# Patient Record
Sex: Female | Born: 1995 | Race: White | Hispanic: No | Smoking: Never smoker
Health system: Southern US, Community
[De-identification: ages and names within clinical notes are randomized; demographics above are authoritative.]

## PROBLEM LIST (undated history)

## (undated) DIAGNOSIS — F419 Anxiety disorder, unspecified: Secondary | ICD-10-CM

## (undated) DIAGNOSIS — F329 Major depressive disorder, single episode, unspecified: Secondary | ICD-10-CM

## (undated) DIAGNOSIS — F32A Depression, unspecified: Secondary | ICD-10-CM

## (undated) HISTORY — DX: Anxiety disorder, unspecified: F41.9

## (undated) HISTORY — DX: Depression, unspecified: F32.A

## (undated) HISTORY — DX: Major depressive disorder, single episode, unspecified: F32.9

## (undated) HISTORY — PX: TONSILLECTOMY AND ADENOIDECTOMY: SUR1326

---
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
## 2017-03-07 ENCOUNTER — Ambulatory Visit (INDEPENDENT_AMBULATORY_CARE_PROVIDER_SITE_OTHER): Payer: Managed Care, Other (non HMO) | Admitting: Family Medicine

## 2017-03-07 ENCOUNTER — Encounter: Payer: Self-pay | Admitting: Family Medicine

## 2017-03-07 VITALS — BP 110/81 | HR 80 | Temp 98.6°F | Resp 16 | Ht 69.0 in | Wt 228.5 lb

## 2017-03-07 DIAGNOSIS — F411 Generalized anxiety disorder: Secondary | ICD-10-CM | POA: Diagnosis not present

## 2017-03-07 MED ORDER — SERTRALINE HCL 100 MG PO TABS: 100.0000 mg | ORAL_TABLET | Freq: Every day | ORAL | 3 refills | Status: DC

## 2017-03-07 NOTE — Assessment & Plan Note (Signed)
Deteriorated.  Pt is going through a very stressful time w/ upcoming graduation.  A lot of uncertainty.  Explained to her that this is normal and does not indicate there's something wrong with her.  Will increase Zoloft to 100mg  daily.  Encouraged stress management.  She is cutting back her time at work.  Applauded this decision.

## 2017-03-07 NOTE — Patient Instructions (Signed)
Follow up in 1 month to recheck anxiety Increase the Zoloft to 100mg - 2 of what you have at home and 1 of the new prescription Continue to work on time for you!!!  I'm proud of you for cutting back at work! Call with any questions or concerns! CONGRATS!!!!!!

## 2017-03-07 NOTE — Progress Notes (Signed)
   Subjective:    Patient ID: Stefanie Farrell, female    DOB: 07-Nov-1995, 21 y.o.   MRN: [id number]  HPI Anxiety- currently on Zoloft 50mg  daily.  Pt is working 20 hrs/week and going to school.  Pt has been crying daily x2 weeks.  Prior to 2 weeks ago, pt noted that anxiety sxs were not as well controlled as when she started the medication but better than the last 2 weeks.  Pt told work that she needed to cut back to 3 days next week (works at [employer]).  Pt reports sleeping well at night.  Pt is working on stress relief.  Has not had any additional seizure like spells.   Review of Systems For ROS see HPI     Objective:   Physical Exam  Constitutional: She is oriented to person, place, and time. She appears well-developed and well-nourished. No distress.  obese  HENT:  Head: Normocephalic and atraumatic.  Neurological: She is alert and oriented to person, place, and time.  Skin: Skin is warm and dry.  Psychiatric: She has a normal mood and affect. Her behavior is normal. Thought content normal.  Vitals reviewed.         Assessment & Plan:

## 2017-03-07 NOTE — Progress Notes (Signed)
Pre visit review using our clinic review tool, if applicable. No additional management support is needed unless otherwise documented below in the visit note. 

## 2017-03-18 ENCOUNTER — Other Ambulatory Visit: Payer: Self-pay | Admitting: Family Medicine

## 2017-04-08 ENCOUNTER — Ambulatory Visit (INDEPENDENT_AMBULATORY_CARE_PROVIDER_SITE_OTHER): Payer: Managed Care, Other (non HMO) | Admitting: Family Medicine

## 2017-04-08 ENCOUNTER — Encounter: Payer: Self-pay | Admitting: Family Medicine

## 2017-04-08 VITALS — BP 112/74 | HR 90 | Resp 16 | Ht 69.0 in | Wt 233.5 lb

## 2017-04-08 DIAGNOSIS — Z3009 Encounter for other general counseling and advice on contraception: Secondary | ICD-10-CM

## 2017-04-08 DIAGNOSIS — F411 Generalized anxiety disorder: Secondary | ICD-10-CM | POA: Diagnosis not present

## 2017-04-08 MED ORDER — SERTRALINE HCL 100 MG PO TABS: 100.0000 mg | ORAL_TABLET | Freq: Every day | ORAL | 3 refills | Status: DC

## 2017-04-08 MED ORDER — NORETHINDRONE ACET-ETHINYL EST 1-20 MG-MCG PO TABS: 1.0000 | ORAL_TABLET | Freq: Every day | ORAL | 11 refills | Status: DC

## 2017-04-08 NOTE — Assessment & Plan Note (Signed)
New.  Pt is not yet sexually active but plans to be.  Applauded her decision to protect herself.  Start OCPs.  Reviewed appropriate start date, possible side effects.  Pt expressed understanding and is in agreement w/ plan.

## 2017-04-08 NOTE — Progress Notes (Signed)
   Subjective:    Patient ID: Stefanie Farrell, female    DOB: [birth year], 21 y.o.   MRN: [id number]  HPI Anxiety- improved since increasing Zoloft to 100mg  daily.  Pt reports less anxiety, feeling less wound up.  Able to fall asleep but some tossing and turning at night.  Pt reports being happy w/ dose.  Irregular menses- pt reports 'very irregular periods'.  Was on OCPs previously but 'gained 50 lbs'.   Review of Systems For ROS see HPI     Objective:   Physical Exam  Constitutional: She is oriented to person, place, and time. She appears well-developed and well-nourished. No distress.  obese  Neurological: She is alert and oriented to person, place, and time.  Skin: Skin is warm and dry.  Psychiatric: She has a normal mood and affect. Her behavior is normal. Thought content normal.  Vitals reviewed.         Assessment & Plan:

## 2017-04-08 NOTE — Progress Notes (Signed)
Pre visit review using our clinic review tool, if applicable. No additional management support is needed unless otherwise documented below in the visit note. 

## 2017-04-08 NOTE — Assessment & Plan Note (Signed)
Improved since increasing Zoloft.  Pt is happy w/ dose.  No med changes at this time.

## 2017-04-08 NOTE — Patient Instructions (Signed)
Follow up as needed/scheduled Start the birth control the Sunday after you start your cycle Continue the Zoloft once daily- I'm so glad you're feeling better! Call with any questions or concerns Have a great summer!!!

## 2017-04-28 ENCOUNTER — Encounter: Payer: Self-pay | Admitting: Family Medicine

## 2017-04-28 ENCOUNTER — Encounter (HOSPITAL_COMMUNITY): Payer: Self-pay | Admitting: *Deleted

## 2017-04-28 ENCOUNTER — Ambulatory Visit (INDEPENDENT_AMBULATORY_CARE_PROVIDER_SITE_OTHER): Payer: Managed Care, Other (non HMO) | Admitting: Family Medicine

## 2017-04-28 ENCOUNTER — Emergency Department (HOSPITAL_BASED_OUTPATIENT_CLINIC_OR_DEPARTMENT_OTHER)
Admit: 2017-04-28 | Discharge: 2017-04-28 | Disposition: A | Discharge status: Home / Self Care | Payer: Managed Care, Other (non HMO)

## 2017-04-28 ENCOUNTER — Emergency Department (HOSPITAL_COMMUNITY)
Admission: EM | Admit: 2017-04-28 | Discharge: 2017-04-28 | Disposition: A | Discharge status: Home / Self Care | Payer: Managed Care, Other (non HMO) | Attending: Emergency Medicine | Admitting: Emergency Medicine

## 2017-04-28 VITALS — BP 114/80 | HR 89 | Temp 99.3°F | Resp 16 | Ht 69.0 in | Wt 239.0 lb

## 2017-04-28 DIAGNOSIS — R319 Hematuria, unspecified: Secondary | ICD-10-CM | POA: Diagnosis not present

## 2017-04-28 DIAGNOSIS — R8299 Other abnormal findings in urine: Secondary | ICD-10-CM

## 2017-04-28 DIAGNOSIS — M791 Myalgia, unspecified site: Secondary | ICD-10-CM

## 2017-04-28 DIAGNOSIS — R82998 Other abnormal findings in urine: Secondary | ICD-10-CM

## 2017-04-28 DIAGNOSIS — R252 Cramp and spasm: Secondary | ICD-10-CM | POA: Insufficient documentation

## 2017-04-28 LAB — BASIC METABOLIC PANEL
Anion gap: 6 (ref 5–15)
BUN: 12 mg/dL (ref 6–20)
CHLORIDE: 105 mmol/L (ref 101–111)
CO2: 26 mmol/L (ref 22–32)
CREATININE: 0.85 mg/dL (ref 0.44–1.00)
Calcium: 9.3 mg/dL (ref 8.9–10.3)
GFR calc Af Amer: >60 mL/min (ref >60)
GFR calc non Af Amer: >60 mL/min (ref >60)
GLUCOSE: 96 mg/dL (ref 65–99)
Potassium: 4 mmol/L (ref 3.5–5.1)
Sodium: 137 mmol/L (ref 135–145)

## 2017-04-28 LAB — CBC WITH DIFFERENTIAL/PLATELET
Basophils Absolute: 0.1 10*3/uL (ref 0.0–0.1)
Basophils Relative: 1 %
EOS ABS: 0.3 10*3/uL (ref 0.0–0.7)
EOS PCT: 3 %
HCT: 38.9 % (ref 36.0–46.0)
HEMOGLOBIN: 12.8 g/dL (ref 12.0–15.0)
LYMPHS ABS: 2.7 10*3/uL (ref 0.7–4.0)
Lymphocytes Relative: 31 %
MCH: 30.1 pg (ref 26.0–34.0)
MCHC: 32.9 g/dL (ref 30.0–36.0)
MCV: 91.5 fL (ref 78.0–100.0)
MONOS PCT: 7 %
Monocytes Absolute: 0.6 10*3/uL (ref 0.1–1.0)
NEUTROS PCT: 58 %
Neutro Abs: 5.2 10*3/uL (ref 1.7–7.7)
Platelets: 268 10*3/uL (ref 150–400)
RBC: 4.25 MIL/uL (ref 3.87–5.11)
RDW: 13.2 % (ref 11.5–15.5)
WBC: 8.9 10*3/uL (ref 4.0–10.5)

## 2017-04-28 LAB — POCT URINALYSIS DIPSTICK
Bilirubin, UA: NEGATIVE
Glucose, UA: NEGATIVE
Nitrite, UA: NEGATIVE
PH UA: 6.5 (ref 5.0–8.0)
SPEC GRAV UA: 1.02 (ref 1.010–1.025)
UROBILINOGEN UA: 0.2 U/dL

## 2017-04-28 LAB — URINALYSIS, ROUTINE W REFLEX MICROSCOPIC
Bilirubin Urine: NEGATIVE
Glucose, UA: NEGATIVE mg/dL
Hgb urine dipstick: NEGATIVE
KETONES UR: NEGATIVE mg/dL
Nitrite: NEGATIVE
PH: 6 (ref 5.0–8.0)
PROTEIN: NEGATIVE mg/dL
Specific Gravity, Urine: 1.02 (ref 1.005–1.030)

## 2017-04-28 LAB — MAGNESIUM: MAGNESIUM: 2 mg/dL (ref 1.7–2.4)

## 2017-04-28 LAB — POC URINE PREG, ED: PREG TEST UR: NEGATIVE

## 2017-04-28 NOTE — ED Notes (Signed)
Patient ambulated to BR and tolerated well. Patient providing urine specimen.

## 2017-04-28 NOTE — Progress Notes (Signed)
Preliminary results by tech - Venous Duplex Lower Ext. Completed. Negative for deep and superficial vein thrombosis in both legs.  Denine Brotz, BS, RDMS, RVT  

## 2017-04-28 NOTE — Progress Notes (Signed)
   Subjective:    Patient ID: Stefanie Farrell, female    DOB: 1996/01/05, 21 y.o.   MRN: [id number]  HPI ER f/u- pt was seen today in ER after awaking w/ severe leg cramps in feet and extending up legs.  Mom was concerned about possible blood clots in setting of birth control.  Pt reports legs feel better after using heating pad but still are sore and weak.  Pt reports drinking fluids.  [place] had adequate water intake.  Pt reports feeling well prior to sudden onset of leg pain.  Pt reports feeling well now.  Denies urinary sxs w/ exception of cloudy urine   Review of Systems For ROS see HPI     Objective:   Physical Exam  Constitutional: Stefanie Farrell is oriented to person, place, and time. Stefanie Farrell appears well-developed and well-nourished. No distress.  obese  HENT:  Head: Normocephalic and atraumatic.  Musculoskeletal: Stefanie Farrell exhibits tenderness (mild TTP over bilateral lower legs.  no TTP over neck, shoulders, arms, back, thighs). Stefanie Farrell exhibits no edema.  Neurological: Stefanie Farrell is alert and oriented to person, place, and time.  Skin: Skin is warm and dry. No rash noted. No erythema.  Psychiatric: Stefanie Farrell has a normal mood and affect. Her behavior is normal. Thought content normal.  Vitals reviewed.         Assessment & Plan:  Myalgias- new.  Suspect pt's muscle pain/cramps are due to either dehydration from her weekend at the lake or a viral illness as Stefanie Farrell has a low grade temp.  Reviewed lab results from ER- WBC and Hgb normal.  Venous dopplers WNL.  Check LFTs, CK.  Encouraged ibuprofen and hydration.  Will follow  Leukocytes in urine- new.  Reviewed UA from ED and given multiple abnormalities will send urine for culture despite pt's lack of sxs.  Pt expressed understanding and is in agreement w/ plan.

## 2017-04-28 NOTE — Discharge Instructions (Addendum)
You were seen in the emergency department for muscle cramps. Your exam and workup was reassuring. Your ultrasound did not show a blood clot. Please follow up with your primary care doctor today at 2:00PM regarding today's ER visit and medications; your appointment has already been scheduled. Return to the ER if you experience fevers, chills, unexplained weight loss, dizziness, headaches, neck pain, vision or gait changes, chest pain, shortness of breath, abdominal pain, nausea, vomiting, extremity numbness or tingling,extremity weakness, rash, worsening symptoms, or any additional concerns.

## 2017-04-28 NOTE — Patient Instructions (Signed)
Follow up as needed/scheduled We'll notify you of your lab results and urine culture Drink plenty of fluids!  If you think you've had enough- drink some more! Ibuprofen as needed for pain Alternate ice/heat or whichever feels better Call with any questions or concerns Hang in there!!!

## 2017-04-28 NOTE — ED Provider Notes (Signed)
[facility] Emergency Department Provider Note  ED Clinical Impression   Muscle cramps  History   Chief Complaint No chief complaint on file.   HPI  Patient is a 21 y.o. female with a PMH of anxiety who presents to ED for myalgias to bilateral lower legs, onset at 0400 today, states constant since, no known exacerbating or alleviating factors, has not tried OTC medications. States that she was recently restarted on OCPs, currently only Loestrin; no other medications changes, on Zoloft for apporx 1 year without issue. Denies extremity numbness, tingling, or weakness. Has not noticed swelling or redness to bilateral LE. No recent trauma, fall, or known injury to LE. Also states that she recently was outside this weekend but was drinking a lot of water to stay hydrated. Denies recent travel, prolonged immobilization, recent surgery, or hx of DVT/PE. No anticoag use. Denies fevers, chills, unexplained weight loss, dizziness, vision or gait changes, CP, SOB, cough, pleurisy, abd pain, n/v/d, dysuria, hematuria, vaginal bleeding or discharge, extremity numbness or tingling, extremity weakness, or any additional concerns.   No past medical history on file.  Past Surgical History:  Procedure Laterality Date  . TONSILLECTOMY AND ADENOIDECTOMY      Current Outpatient Rx  . Order #: [id number] Class: Normal  . Order #: [id number] Class: Normal    Allergies Lexapro [escitalopram oxalate] and Penicillin g  No family history on file.  Social History Social History  Substance Use Topics  . Smoking status: Never Smoker  . Smokeless tobacco: Never Used  . Alcohol use No    Review of Systems  Constitutional: Negative for fever, chills, or unexplained weight loss. Eyes: Negative for visual changes. Cardiovascular: Negative for chest pain, palpitations, or extremity swelling. Respiratory: Negative for shortness of breath, cough, or pleurisy. Gastrointestinal: Negative for abdominal pain,  nausea, vomiting, or diarrhea. Genitourinary: Negative for dysuria, urinary frequency, or hematuria. Musculoskeletal: +muscle cramps. Negative for back pain or extremity swelling. Skin: Negative for rash. Neurological: Negative for headaches, dizziness, focal weakness, or numbness/tingling.  Physical Exam   VITAL SIGNS:   ED Triage Vitals  Enc Vitals Group     BP      Pulse      Resp      Temp      Temp src      SpO2      Weight      Height      Head Circumference      Peak Flow      Pain Score      Pain Loc      Pain Edu?      Excl. in GC?     Constitutional: Alert and oriented. Well appearing and in no respiratory apparent distress. Eyes: PERRL, EOMI, Conjunctivae normal ENT      Head: Normocephalic and atraumatic.      Ears: TM intact bilaterally without erythema or effusion, no hemotympanum, external ear canals normal.       Mouth/Throat: Mucous membranes are moist. Oropharynx without erythema or exudate. Normal voice, handling secretions normally.      Neck: Supple, no nuchal signs, full active ROM of neck.  Cardiovascular: Normal S1 S2, regular rhythm, normal rate. Normal and symmetric distal pulses are present in all extremities. Respiratory: Breath sounds clear and equal bilaterally. No wheezes, rales, or rhonchi. Normal respiratory effort.  Gastrointestinal: Abdomen soft and nontender. No rebound or guarding. There is no CVA tenderness. Back: No midline tenderness, no stepoff.  Musculoskeletal:Full active ROM to bilateral hips,  knees, ankles, and feet without ttp. +very, very mild ttp over bilateral calves, no erythema, warmth, or generalized swelling noted. No palpable cords. Nontender with normal range of motion in all other extremities.      Right lower leg: No edema.      Left lower leg: No edema. Neurologic: Speech clear. Alert and appropriate, no gross focal neurologic deficits are appreciated. Gait steady with ambulation. Equal strength in all four  extremities. Extremities neurovascularly intact.  Skin: Skin is warm, dry, and intact. No rash noted. Psychiatric: Mood and affect are normal. Speech and behavior are normal.  Labs   Labs Reviewed - No data to display  Radiology   VAS [place] LOWER EXTREMITY VENOUS (DVT)   ED Course, Assessment and Plan   Pt is a 21 y/o F who presents to ED for myalgias. Will get labs for electrolyte abnormality given recently being outside in heat; will also get bilateral venous ultrasound to r/o DVT. WELLs criteria 0. No acute neuro deficits. Full active ROM to bilateral LE without pain. If negative, likely dc with PCP follow up for re-eval and possible medication change.  9:55 AM CBC and BMP unremarkable, mg normal. Upreg negative. Pending u/s results.   10:03 AM Discussed with patient's PCP (Dr. Cherre Robins) office, can schedule a follow up appointment for today. Venous duplex negative for deep and superficial vein thrombosis in both legs. Pt and family updated on results and plan.   10:09 AM Discussed results, discharge instructions, rx and safety, return precautions, and follow up. Pt verbalizes understanding using verbal teachback and agrees with plan, denies any additional concerns.   Previous chart, nursing notes, and vital signs reviewed.    Pertinent labs & imaging results that were available during my care of the patient were reviewed by me and considered in my medical decision making (see chart for details).     Veleria Barnhardt, Hinton Dyer, NP 04/28/17 1617    Shaune Pollack, MD 04/29/17 1438

## 2017-04-28 NOTE — Progress Notes (Signed)
Pre visit review using our clinic review tool, if applicable. No additional management support is needed unless otherwise documented below in the visit note. 

## 2017-04-28 NOTE — ED Notes (Signed)
Pt transported to Vascular US 

## 2017-04-28 NOTE — ED Notes (Signed)
Patient to u/s 

## 2017-04-28 NOTE — ED Triage Notes (Signed)
Pt awoke with bil leg cramping at 4 am.  Some relief with ambulation.  Started birth control 16 days ago.

## 2017-04-30 LAB — HEPATIC FUNCTION PANEL
ALBUMIN: 4.2 g/dL (ref 3.5–5.2)
ALK PHOS: 56 U/L (ref 47–119)
ALT: 8 U/L (ref 0–35)
AST: 13 U/L (ref 0–37)
BILIRUBIN TOTAL: 0.4 mg/dL (ref 0.3–1.2)
Bilirubin, Direct: 0.1 mg/dL (ref 0.0–0.3)
Total Protein: 6.8 g/dL (ref 6.0–8.3)

## 2017-04-30 LAB — CK: CK TOTAL: 59 U/L (ref 7–177)

## 2017-04-30 LAB — URINE CULTURE

## 2017-04-30 NOTE — Progress Notes (Signed)
Called pt and lmovm to return call.

## 2017-05-01 NOTE — Progress Notes (Signed)
Called pt and lmovm to return call.

## 2017-05-02 ENCOUNTER — Other Ambulatory Visit: Payer: Self-pay | Admitting: General Practice

## 2017-05-02 MED ORDER — CEPHALEXIN 500 MG PO CAPS: 500.0000 mg | ORAL_CAPSULE | Freq: Two times a day (BID) | ORAL | 0 refills | Status: DC

## 2017-05-28 ENCOUNTER — Telehealth: Payer: Self-pay | Admitting: Family Medicine

## 2017-05-28 NOTE — Telephone Encounter (Signed)
Pt mom made aware. She will have pt call and make an appointment.

## 2017-05-28 NOTE — Telephone Encounter (Signed)
Mom called to states that she has some concerns regarding pt mental health, stating the the meds are not working for her anymore and would like a call back. Mom would also like a recommendation on someone that pt could go and talk to. Please advise

## 2017-05-28 NOTE — Telephone Encounter (Signed)
FYI

## 2017-05-28 NOTE — Telephone Encounter (Signed)
Unfortunately we do not have a signed DPR on file to allow us to talk to mom regarding pt's health issues since she is now over 18.  If pt gives us permission, we can talk to mom or she can accompany Stefanie Farrell to an appointment regarding this issue

## 2017-06-06 ENCOUNTER — Ambulatory Visit (INDEPENDENT_AMBULATORY_CARE_PROVIDER_SITE_OTHER): Payer: Managed Care, Other (non HMO) | Admitting: Family Medicine

## 2017-06-06 ENCOUNTER — Encounter: Payer: Self-pay | Admitting: Family Medicine

## 2017-06-06 VITALS — BP 110/72 | HR 95 | Temp 98.1°F | Resp 16 | Ht 69.0 in | Wt 236.1 lb

## 2017-06-06 DIAGNOSIS — N923 Ovulation bleeding: Secondary | ICD-10-CM | POA: Diagnosis not present

## 2017-06-06 DIAGNOSIS — F411 Generalized anxiety disorder: Secondary | ICD-10-CM

## 2017-06-06 DIAGNOSIS — R21 Rash and other nonspecific skin eruption: Secondary | ICD-10-CM | POA: Diagnosis not present

## 2017-06-06 MED ORDER — TRIAMCINOLONE ACETONIDE 0.1 % EX OINT: 1.0000 "application " | TOPICAL_OINTMENT | Freq: Two times a day (BID) | CUTANEOUS | 1 refills | Status: DC

## 2017-06-06 MED ORDER — SERTRALINE HCL 50 MG PO TABS: 50.0000 mg | ORAL_TABLET | Freq: Every day | ORAL | 3 refills | Status: DC

## 2017-06-06 NOTE — Patient Instructions (Signed)
Follow up in 2-3 weeks to recheck mood DECREASE the Zoloft to 50mg  daily- 1/2 tab of what you have at home and 1 of the new prescription We'll call you with your GYN and counseling appts Continue the birth control for now Use the Triamcinolone ointment twice daily for the rash Continue the Zyrtec and Benadryl for the itching Remember- kind inner voice!  It's the most important! Call with any questions or concerns Hang in there!  You are definitely not crazy!!!

## 2017-06-06 NOTE — Progress Notes (Signed)
Pre visit review using our clinic review tool, if applicable. No additional management support is needed unless otherwise documented below in the visit note. 

## 2017-06-06 NOTE — Progress Notes (Signed)
   Subjective:    Patient ID: Stefanie Farrell, female    DOB: [birth year], 21 y.o.   MRN: [id number]  HPI Anxiety- pt reports 'she is emotionally crazy- cry off the chain every day'.  Pt reports she has been crying since March (per boyfriend's report).  Mom states she cannot handle 'any stress or anything that disrupts her day'.  sxs are worse late afternoon and evenings/nights.  Pt feels everyone is upset with her.  Parents have arranged a counseling appt.  Pt will yell and lash out at those close to her.  Rash- pt developed a rash 2 weeks ago.  sxs started on hands and feet.  Was seen at [facility] and started on Methylprednisolone which caused drug eruption on arms and trunk.  Areas are itchy and also burn.  Taking benadryl and zyrtec.  No fevers.  No changes in exposures, no recent travel.  Lots of stress.   Review of Systems For ROS see HPI     Objective:   Physical Exam  Constitutional: She is oriented to person, place, and time. She appears well-developed and well-nourished. No distress.  obese  HENT:  Head: Normocephalic and atraumatic.  Neurological: She is alert and oriented to person, place, and time.  Skin: Skin is warm and dry. Rash (diffuse urticaria) noted.  Psychiatric: She has a normal mood and affect. Her behavior is normal. Thought content normal.  Vitals reviewed.         Assessment & Plan:  Anxiety/depression- deteriorated.  Pt continues to struggle w/ high anxiety and is now having depression with thoughts that everyone 'hates' her and that she would be better off not here.  Today, she is able to contract for safety.  Both parents are present.  Discussed need for counseling in order to cope- referral placed.  Will decrease Zoloft back to 50mg  daily as sxs seemed to intensify w/ increased SSRI dose.  Discussed possibility of [employer] testing to determine which medication would be most effective and eliminate months of trial and error.  Total times spent w/ pt and family  counseling on anxiety and depression- 1 hr w/ more than 50% counseling.  Rash- consistent w/ urticaria.  Suspect this is stress related as redness and itching improved during visit as pt was able to talk about some of her feelings.  Continue Zyrtec and Benadryl.  Add topical steroid.  Ovulatory bleeding- pt is having mid cycle breakthrough bleeding consistent w/ ovulatory bleeding.  Rather than switch OCPs at this time since she is struggling w/ anxiety and depression, will refer to GYN.  Pt expressed understanding and is in agreement w/ plan.

## 2017-06-26 ENCOUNTER — Encounter: Payer: Self-pay | Admitting: Family Medicine

## 2017-06-26 ENCOUNTER — Ambulatory Visit (INDEPENDENT_AMBULATORY_CARE_PROVIDER_SITE_OTHER): Payer: Managed Care, Other (non HMO) | Admitting: Family Medicine

## 2017-06-26 VITALS — BP 118/80 | HR 81 | Resp 16 | Ht 69.0 in | Wt 238.5 lb

## 2017-06-26 DIAGNOSIS — F419 Anxiety disorder, unspecified: Secondary | ICD-10-CM | POA: Diagnosis not present

## 2017-06-26 DIAGNOSIS — F329 Major depressive disorder, single episode, unspecified: Secondary | ICD-10-CM | POA: Diagnosis not present

## 2017-06-26 MED ORDER — BUPROPION HCL 75 MG PO TABS: 75.0000 mg | ORAL_TABLET | Freq: Two times a day (BID) | ORAL | 1 refills | Status: DC

## 2017-06-26 NOTE — Progress Notes (Signed)
   Subjective:    Patient ID: Stefanie Farrell, female    DOB: 1996/05/02, 21 y.o.   MRN: [id number]  HPI Anxiety/depression- pt reports some improvement in anxiety/depression since decreasing dose of Zoloft to 50mg  daily.  Pt is working on getting a counseling appt.   Pt reports more depression than anxiety.  Pt reports 'anything negative will make me cry and I dwell on it'.  Pt is not lashing out at those close to her as much as before.     Review of Systems For ROS see HPI     Objective:   Physical Exam  Constitutional: She is oriented to person, place, and time. She appears well-developed and well-nourished. No distress.  obese  Neurological: She is alert and oriented to person, place, and time.  Skin: Skin is warm and dry.  Psychiatric: She has a normal mood and affect. Her behavior is normal. Thought content normal.  Vitals reviewed.         Assessment & Plan:

## 2017-06-26 NOTE — Progress Notes (Signed)
Pre visit review using our clinic review tool, if applicable. No additional management support is needed unless otherwise documented below in the visit note. 

## 2017-06-26 NOTE — Assessment & Plan Note (Signed)
Chronic problem.  Pt continues to struggle w/ depression but she is not very forthcoming about her sxs or her response to medication.  I got much more information last visit when both parents were with her.  She did not do well on Lexapro and did not do well w/ increased doses of Zoloft.  Will try low dose Wellbutrin twice daily to improve depression by using a different chemical class.  Pt expressed understanding and is in agreement w/ plan.

## 2017-06-26 NOTE — Patient Instructions (Signed)
Follow up in 2-3 weeks to recheck mood Start the Wellbutrin (Bupropion) twice daily in addition to the Sertraline Continue to work on stress management- this will be very beneficial Call with any questions or concerns Stay Safe this weekend!!!

## 2017-07-07 ENCOUNTER — Ambulatory Visit: Payer: Managed Care, Other (non HMO) | Admitting: Clinical

## 2017-07-10 ENCOUNTER — Encounter: Payer: Self-pay | Admitting: Family Medicine

## 2017-07-10 ENCOUNTER — Ambulatory Visit (INDEPENDENT_AMBULATORY_CARE_PROVIDER_SITE_OTHER): Payer: Managed Care, Other (non HMO) | Admitting: Family Medicine

## 2017-07-10 VITALS — BP 112/81 | HR 78 | Temp 98.2°F | Resp 16 | Ht 69.0 in | Wt 238.0 lb

## 2017-07-10 DIAGNOSIS — F329 Major depressive disorder, single episode, unspecified: Secondary | ICD-10-CM

## 2017-07-10 DIAGNOSIS — F32A Depression, unspecified: Secondary | ICD-10-CM

## 2017-07-10 DIAGNOSIS — F419 Anxiety disorder, unspecified: Secondary | ICD-10-CM

## 2017-07-10 NOTE — Patient Instructions (Signed)
Follow up in 3 months- sooner if needed- to recheck mood Continue the current medications as is- no changes at this time Keep up the good work!  You look great!!! Call with any questions or concerns Happy Fall!!!

## 2017-07-10 NOTE — Progress Notes (Signed)
Pre visit review using our clinic review tool, if applicable. No additional management support is needed unless otherwise documented below in the visit note. 

## 2017-07-10 NOTE — Progress Notes (Signed)
   Subjective:    Patient ID: Stefanie Farrell, female    DOB: 1995/12/26, 21 y.o.   MRN: [id number]  HPI Depression- Wellbutrin was added last visit and pt states, 'i love it'.  Energy level is better.  Anxiety is 'better.  I went from a mental breakdown every day to only once a week'.  No thoughts of self harm.  Parents have noticed improvement.  Pt was previously feeling that anxiety was better but depression was worse on just the Zoloft.  Feels there is a better balance with this new medication.  Pt has counseling appt scheduled.     Review of Systems For ROS see HPI     Objective:   Physical Exam  Constitutional: She is oriented to person, place, and time. She appears well-developed and well-nourished. No distress.  HENT:  Head: Normocephalic and atraumatic.  Neurological: She is alert and oriented to person, place, and time.  Skin: Skin is warm and dry.  Psychiatric: She has a normal mood and affect. Her behavior is normal. Thought content normal.  Vitals reviewed.         Assessment & Plan:

## 2017-07-10 NOTE — Assessment & Plan Note (Signed)
Ongoing issue.  Pt reports feeling much better since adding the Wellbutrin.  Is fearful of med changes due to possible side effects.  Will hold on med changes at this time but continue to follow closely.  Pt expressed understanding and is in agreement w/ plan.

## 2017-07-15 ENCOUNTER — Ambulatory Visit (INDEPENDENT_AMBULATORY_CARE_PROVIDER_SITE_OTHER): Payer: 59 | Admitting: Clinical

## 2017-07-15 DIAGNOSIS — F4321 Adjustment disorder with depressed mood: Secondary | ICD-10-CM

## 2017-07-24 ENCOUNTER — Ambulatory Visit (INDEPENDENT_AMBULATORY_CARE_PROVIDER_SITE_OTHER): Payer: 59 | Admitting: Clinical

## 2017-07-24 DIAGNOSIS — F4321 Adjustment disorder with depressed mood: Secondary | ICD-10-CM

## 2017-07-29 ENCOUNTER — Ambulatory Visit (INDEPENDENT_AMBULATORY_CARE_PROVIDER_SITE_OTHER): Payer: 59 | Admitting: Clinical

## 2017-07-29 DIAGNOSIS — F4321 Adjustment disorder with depressed mood: Secondary | ICD-10-CM | POA: Diagnosis not present

## 2017-08-05 ENCOUNTER — Ambulatory Visit (INDEPENDENT_AMBULATORY_CARE_PROVIDER_SITE_OTHER): Payer: 59 | Admitting: Clinical

## 2017-08-05 DIAGNOSIS — F4321 Adjustment disorder with depressed mood: Secondary | ICD-10-CM

## 2017-08-12 ENCOUNTER — Ambulatory Visit: Payer: Self-pay | Admitting: Clinical

## 2017-08-21 ENCOUNTER — Ambulatory Visit: Payer: 59 | Admitting: Clinical

## 2017-10-02 ENCOUNTER — Ambulatory Visit: Payer: Self-pay | Admitting: Family Medicine

## 2017-10-08 ENCOUNTER — Encounter: Payer: Self-pay | Admitting: Family Medicine

## 2017-10-08 ENCOUNTER — Ambulatory Visit: Payer: Managed Care, Other (non HMO) | Admitting: Family Medicine

## 2017-10-08 VITALS — BP 110/78 | HR 85 | Temp 99.0°F | Resp 14 | Ht 69.0 in | Wt 243.0 lb

## 2017-10-08 DIAGNOSIS — F329 Major depressive disorder, single episode, unspecified: Secondary | ICD-10-CM | POA: Diagnosis not present

## 2017-10-08 DIAGNOSIS — F419 Anxiety disorder, unspecified: Secondary | ICD-10-CM

## 2017-10-08 NOTE — Patient Instructions (Signed)
Schedule your complete physical in 3-4 months Keep up the good work!  You look great!!! NO MEDS AT THIS TIME!!! Call with any questions or concerns HAPPY NEW YEAR!!!

## 2017-10-08 NOTE — Assessment & Plan Note (Signed)
Pt's depression is much better since coming off medication.  She is having increased situational anxiety but she was able to learn some tools from her counselor to talk herself down when it occurs.  Applauded her efforts.  Will continue to follow.  Pt expressed understanding and is in agreement w/ plan.

## 2017-10-08 NOTE — Progress Notes (Signed)
   Subjective:    Patient ID: Stefanie Farrell, female    DOB: 01-21-1996, 21 y.o.   MRN: [id number]  HPI Anxiety/Depression- ongoing issue.  Pt stopped both the Wellbutrin and the Sertraline about 6 weeks ago.  Pt reports feeling 'very much' better since stopping meds.  Family and friends have noticed improvement in mood since stopping meds.  Pt notes return of situational anxiety- triggered by crowds, loud noise, commotion- but feels that the 'trade off' of situation anxiety is 'better than constant depression'.  Was previously seeing someone for counseling and this was beneficial but pt has stopped going b/c 'I didn't need it anymore'.     Review of Systems For ROS see HPI     Objective:   Physical Exam  Constitutional: She is oriented to person, place, and time. She appears well-developed and well-nourished. No distress.  Neurological: She is alert and oriented to person, place, and time.  Psychiatric: She has a normal mood and affect. Her behavior is normal. Thought content normal.  Vitals reviewed.         Assessment & Plan:

## 2018-01-15 ENCOUNTER — Telehealth: Payer: Self-pay | Admitting: Family Medicine

## 2018-01-15 NOTE — Telephone Encounter (Unsigned)
Copied from CRM [id number]. Topic: Quick Communication - See Telephone Encounter >> Jan 15, 2018  2:08 PM Stefanie Farrell, Stefanie Farrell wrote: CRM for notification. See Telephone encounter for: 01/15/18. Pt mom calling to request that Dr. Guss Bundeabor give them the name of the test that tells what is the best medication for the patient and let her know if it is a blood test or saliva so that she can contact her insurance [employer] to see if will be covered or they will need to pay out of pocket   Best number [phone]

## 2018-01-15 NOTE — Telephone Encounter (Signed)
Called and left a detailed message to advise pt mom the the name of the test is Genesight and it is a Saliva test (we swab the inside of her cheek)

## 2018-01-16 NOTE — Telephone Encounter (Signed)
Pt mother called and said that her insurance does not cover the genesight test. She is asking if it is done in the office or if they need a referral, where it is done, and approximate self pay cost. They are wanting to do it if it is possible financially. Please advise. Call back [phone].

## 2018-01-16 NOTE — Telephone Encounter (Signed)
It is done here in the office.  Typically if insurance doesn't cover the test, there is a max out of pocket cost of around $350.  There is also a payment plan available.  We can mail them the brochure which has the financing information on the last page.  They can also call Genesight to discuss

## 2018-01-16 NOTE — Telephone Encounter (Signed)
Called and left a detailed message on pt mom voicemail to advise of the PCP recommendations. Genesight booklet was placed in the mail today and contact number for genesight customer service [phone] was also given to pt mom.

## 2018-01-16 NOTE — Telephone Encounter (Signed)
Please advise 

## 2018-01-29 ENCOUNTER — Encounter: Payer: Managed Care, Other (non HMO) | Admitting: Family Medicine

## 2018-02-20 ENCOUNTER — Telehealth: Payer: Self-pay | Admitting: *Deleted

## 2018-02-20 ENCOUNTER — Encounter: Payer: Self-pay | Admitting: Neurology

## 2018-02-20 DIAGNOSIS — R569 Unspecified convulsions: Secondary | ICD-10-CM

## 2018-02-20 NOTE — Telephone Encounter (Signed)
I need to know why they want referral to determine who is able to see her

## 2018-02-20 NOTE — Telephone Encounter (Signed)
Ok for referral to [facility]- dx seizure like activity, please ask for female provider.  Also, pt should not drive at night if she is having issues w/ lights.  This is for her safety and everyone else's

## 2018-02-20 NOTE — Telephone Encounter (Signed)
Called and spoke with pt mom. Pt is still experiencing the same issues as 08/2016. She is having issues with the flashing lights at night when she is driving. We had placed a referral to pediatric neuro for seizure-like activity but pt was not able to set up an appt at that time.

## 2018-02-20 NOTE — Telephone Encounter (Signed)
Copied from CRM [id number]. Topic: Referral - Request >> Feb 20, 2018  2:37 PM Crist Infante wrote: Reason for CRM: pt would like a referral to [facility].  Whoever Dr Beverely Low thinks is good, but no female.  Mom thinks Dr Karel Jarvis has good reviews. Mom prefers you call her to set appt because pt cannot have her phone at work during the day.

## 2018-02-20 NOTE — Addendum Note (Signed)
Addended by: Geannie Risen on: 02/20/2018 03:29 PM   Modules accepted: Orders

## 2018-02-20 NOTE — Telephone Encounter (Signed)
Pt family made aware of PCP recommendations. Referral placed.

## 2018-02-23 ENCOUNTER — Other Ambulatory Visit: Payer: Self-pay | Admitting: Family Medicine

## 2018-04-02 ENCOUNTER — Ambulatory Visit: Payer: Managed Care, Other (non HMO) | Admitting: Family Medicine

## 2018-04-02 ENCOUNTER — Other Ambulatory Visit: Payer: Self-pay

## 2018-04-02 ENCOUNTER — Encounter: Payer: Self-pay | Admitting: Family Medicine

## 2018-04-02 VITALS — BP 120/80 | HR 117 | Temp 98.0°F | Resp 16 | Ht 69.0 in | Wt 247.0 lb

## 2018-04-02 DIAGNOSIS — F419 Anxiety disorder, unspecified: Secondary | ICD-10-CM | POA: Diagnosis not present

## 2018-04-02 DIAGNOSIS — F329 Major depressive disorder, single episode, unspecified: Secondary | ICD-10-CM

## 2018-04-02 MED ORDER — FLUOXETINE HCL 10 MG PO TABS: 10.0000 mg | ORAL_TABLET | Freq: Every day | ORAL | 3 refills | Status: DC

## 2018-04-02 MED ORDER — TRAZODONE HCL 50 MG PO TABS
25.0000 mg | ORAL_TABLET | Freq: Every evening | ORAL | 3 refills | Status: DC | PRN
Start: 2018-04-02 — End: 2019-03-25

## 2018-04-02 NOTE — Patient Instructions (Signed)
Follow up in 3-4 weeks to recheck mood START the Fluoxetine daily TAKE the Trazodone nightly for sleep- start w/ 1/2 tab and increase to 1 tab if needed I know this sucks right now.... But it will get better! Call with any questions or concerns Hang in there!  Enjoy your trip!!!

## 2018-04-02 NOTE — Progress Notes (Signed)
   Subjective:    Patient ID: Stefanie Farrell, female    DOB: January 25, 1996, 22 y.o.   MRN: [id number]  HPI Depression- pt was last seen in December and at that time felt that things were going well off all medication.  Not long after that appt pt was again worrying 'about every stupid little thing'.  Low motivation.  Sadness.  Poor sleep.  Eating late at night.  Boyfriend of 3.5 yrs 'needs a break'- partly bc of her emotional issues.  A lot of worry comes from the relationship.  Pt did not feel that therapy was effective or productive.  PHQ9 score is 17.   Review of Systems For ROS see HPI     Objective:   Physical Exam  Constitutional: She is oriented to person, place, and time. She appears well-developed and well-nourished. No distress.  obese  HENT:  Head: Normocephalic and atraumatic.  Neurological: She is alert and oriented to person, place, and time.  Skin: Skin is warm and dry.  Psychiatric: She has a normal mood and affect. Her behavior is normal. Thought content normal.  Vitals reviewed.         Assessment & Plan:

## 2018-04-05 NOTE — Assessment & Plan Note (Signed)
Deteriorated.  Pt has hx of depression/anxiety that we have struggled to control w/ Lexapro or Zoloft.  She was interested in coming off the medication this winter and did so successfully- saying she felt great.  But shortly after that, her anxiety worsened.  She was struggling w/ low motivation, sadness, excessive worry.  This has taken a toll on her relationship and boyfriend of over 3 yrs has decided he needs a break- which has acutely worsened her symptoms.  She is not eating or sleeping x3 days.  Based on this, will address her depression by starting low dose Prozac and help the sleep by starting trazodone.  Encouraged her to find a more suitable counselor- happy to assist with this.  Total time spent w/ pt >30 minutes and >50% spent counseling.

## 2018-04-24 ENCOUNTER — Encounter: Payer: Self-pay | Admitting: Family Medicine

## 2018-04-24 ENCOUNTER — Ambulatory Visit: Payer: Managed Care, Other (non HMO) | Admitting: Family Medicine

## 2018-04-24 ENCOUNTER — Other Ambulatory Visit: Payer: Self-pay

## 2018-04-24 VITALS — BP 121/81 | HR 76 | Temp 98.0°F | Resp 16 | Ht 69.0 in | Wt 251.2 lb

## 2018-04-24 DIAGNOSIS — F329 Major depressive disorder, single episode, unspecified: Secondary | ICD-10-CM | POA: Diagnosis not present

## 2018-04-24 DIAGNOSIS — F419 Anxiety disorder, unspecified: Secondary | ICD-10-CM

## 2018-04-24 NOTE — Patient Instructions (Signed)
Schedule your complete physical in 3-4 months (follow up sooner if needed) Continue the Fluoxetine daily If after a few weeks, you feel you want to increase the dose- let me know! Call with any questions or concerns Have a great summer!!

## 2018-04-24 NOTE — Progress Notes (Signed)
   Subjective:    Patient ID: Stefanie Farrell, female    DOB: 1996/01/07, 22 y.o.   MRN: [id number]  HPI  Anxiety/Depression- started on Prozac at last visit but didn't start until Monday.  Pt reports increased energy, increased motivation.  Mom and dad have not yet commented.  Sadness is improving.  No thoughts of self harm.  Back together w/ boyfriend- who has noticed a big difference.     Review of Systems For ROS see HPI     Objective:   Physical Exam  Constitutional: She is oriented to person, place, and time. She appears well-developed and well-nourished. No distress.  Neurological: She is alert and oriented to person, place, and time.  Skin: Skin is warm and dry.  Psychiatric: She has a normal mood and affect. Her behavior is normal. Thought content normal.  Vitals reviewed.         Assessment & Plan:

## 2018-04-24 NOTE — Assessment & Plan Note (Signed)
Pt just started medication and is already noting improvement.  Boyfriend has noticed changes.  No med changes at this time.  Will follow.

## 2018-05-12 ENCOUNTER — Encounter: Payer: Self-pay | Admitting: Neurology

## 2018-05-12 ENCOUNTER — Ambulatory Visit: Payer: Managed Care, Other (non HMO) | Admitting: Neurology

## 2018-05-12 ENCOUNTER — Other Ambulatory Visit: Payer: Self-pay

## 2018-05-12 VITALS — BP 116/70 | HR 96 | Ht 69.0 in | Wt 248.0 lb

## 2018-05-12 DIAGNOSIS — R404 Transient alteration of awareness: Secondary | ICD-10-CM | POA: Diagnosis not present

## 2018-05-12 DIAGNOSIS — F419 Anxiety disorder, unspecified: Secondary | ICD-10-CM | POA: Diagnosis not present

## 2018-05-12 NOTE — Progress Notes (Signed)
NEUROLOGY CONSULTATION NOTE  Kassidy Dockendorf MRN: [id number] DOB: 08-24-1996  Referring provider: Dr. Neena Rhymes Primary care provider: Dr. Neena Rhymes  Reason for consult:  Seizure-like activity  Dear Dr Beverely Low:  Thank you for your kind referral of Stefanie Farrell for consultation of the above symptoms. Although her history is well known to you, please allow me to reiterate it for the purpose of our medical record. The patient was accompanied to the clinic by her mother who also provides collateral information. Records and images were personally reviewed where available.  HISTORY OF PRESENT ILLNESS: This is a very pleasant 22 year old right-handed woman with a history of anxiety and depression, presenting for evaluation of recurrent stereotyped episodes that started in 2016. The first episode occurred while she was at [employer] and was exposed to flashing strobe lights. She started feeling an out of body experience, dizzy, sweaty, and lightheaded like she would pass out. She felt confused and did not know where she was. She was able to talk and understand people around her, no focal numbness/tingling/weakness, no associated headache. The episode lasted a couple of hours. Since then, she has had the exact symptoms with exposure to flashing lights such as police cars at night or quick movements in the school hallway with people passing by her. One time she was wearing beer goggles in Psychology class where objects were distorted and this triggered an episode. She would know it is happening, starting to feel the out of body sensation, but has no control over it. They can last the rest of the day, with the sensation reoccurring several times but less intense, she would continue to feel "like I'm not here" but is able to function normally, no loss of consciousness. She feels she would be staring off when they occur, but she can respond. Her mother denies any staring/unresponsive episodes.  Recently, the episodes have been occurring without clear trigger of flashing lights, she had one while watching TV last night. She can go up to 3 weeks without an episode, then she would have clusters such as the past 5 days. They feel it may relate to her ear infection she has currently. She has noticed that she tends to have more episodes when sleep deprived and anxious. Interestingly, she recalls that when she was taking Zoloft around a year ago, the episodes had disappeared. She had side effects of moodiness and had to stop it, and noticed symptoms recurred. She recently started low dose Prozac 10mg  daily 4 weeks ago and notes that it has helped with her mood, she is smiling and interacting more, but they are not sure it is helping with her anxiety.   She denies any gaps in time, olfactory/gustatory hallucinations, deja vu, rising epigastric sensation, focal numbness/tingling/weakness, myoclonic jerks. Sometimes she feels some facial twitching. She denies any headaches, diplopia, dysarthria/dysphagia, neck/back pain, bowel/bladder dysfunction. Memory is good except for some short-term memory issues. 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   Epilepsy Risk Factors:  She had a normal birth and early development.  There is no history of febrile convulsions, CNS infections such as meningitis/encephalitis, significant traumatic brain injury, neurosurgical procedures, or family history of seizures.  PAST MEDICAL HISTORY: Past Medical History:  Diagnosis Date  . Anxiety   . Depression     PAST SURGICAL HISTORY: Past Surgical History:  Procedure Laterality Date  . TONSILLECTOMY AND ADENOIDECTOMY      MEDICATIONS: Current Outpatient Medications  on File Prior to Visit  Medication Sig Dispense Refill  . FLUoxetine (PROZAC) 10 MG tablet Take 1 tablet (10 mg total) by mouth daily. 30 tablet 3  . JUNEL 1/20 1-20 MG-MCG tablet TAKE ONE TABLET BY MOUTH DAILY 63 tablet 1    . traZODone (DESYREL) 50 MG tablet Take 0.5-1 tablets (25-50 mg total) by mouth at bedtime as needed for sleep. 30 tablet 3   No current facility-administered medications on file prior to visit.     ALLERGIES: Allergies  Allergen Reactions  . Methylprednisolone Hives  . Lexapro [Escitalopram Oxalate] Other (See Comments)    Caused severe mood swings  . Penicillin G Rash    FAMILY HISTORY: No family history on file.  SOCIAL HISTORY: Social History   Socioeconomic History  . Marital status: Single    Spouse name: Not on file  . Number of children: Not on file  . Years of education: Not on file  . Highest education level: Not on file  Occupational History  . Not on file  Social Needs  . Financial resource strain: Not on file  . Food insecurity:    Worry: Not on file    Inability: Not on file  . Transportation needs:    Medical: Not on file    Non-medical: Not on file  Tobacco Use  . Smoking status: Never Smoker  . Smokeless tobacco: Never Used  Substance and Sexual Activity  . Alcohol use: No  . Drug use: No  . Sexual activity: Not on file  Lifestyle  . Physical activity:    Days per week: Not on file    Minutes per session: Not on file  . Stress: Not on file  Relationships  . Social connections:    Talks on phone: Not on file    Gets together: Not on file    Attends religious service: Not on file    Active member of club or organization: Not on file    Attends meetings of clubs or organizations: Not on file    Relationship status: Not on file  . Intimate partner violence:    Fear of current or ex partner: Not on file    Emotionally abused: Not on file    Physically abused: Not on file    Forced sexual activity: Not on file  Other Topics Concern  . Not on file  Social History Narrative  . Not on file    REVIEW OF SYSTEMS: Constitutional: No fevers, chills, or sweats, no generalized fatigue, change in appetite Eyes: No visual changes, double vision,  eye pain Ear, nose and throat: No hearing loss, ear pain, nasal congestion, sore throat Cardiovascular: No chest pain, palpitations Respiratory:  No shortness of breath at rest or with exertion, wheezes GastrointestinaI: No nausea, vomiting, diarrhea, abdominal pain, fecal incontinence Genitourinary:  No dysuria, urinary retention or frequency Musculoskeletal:  No neck pain, back pain Integumentary: No rash, pruritus, skin lesions Neurological: as above Psychiatric: No depression, insomnia, anxiety Endocrine: No palpitations, fatigue, diaphoresis, mood swings, change in appetite, change in weight, increased thirst Hematologic/Lymphatic:  No anemia, purpura, petechiae. Allergic/Immunologic: no itchy/runny eyes, nasal congestion, recent allergic reactions, rashes  PHYSICAL EXAM: Vitals:   05/12/18 0855  BP: 116/70  Pulse: 96  SpO2: 98%   General: No acute distress Head:  Normocephalic/atraumatic Eyes: Fundoscopic exam shows bilateral sharp discs, no vessel changes, exudates, or hemorrhages Neck: supple, no paraspinal tenderness, full range of motion Back: No paraspinal tenderness Heart: regular rate and rhythm  Lungs: Clear to auscultation bilaterally. Vascular: No carotid bruits. Skin/Extremities: No rash, no edema Neurological Exam: Mental status: alert and oriented to person, place, and time, no dysarthria or aphasia, Fund of knowledge is appropriate.  Recent and remote memory are intact. 3/3 delayed recall.  Attention and concentration are normal.    Able to name objects and repeat phrases. Cranial nerves: CN I: not tested CN II: pupils equal, round and reactive to light, visual fields intact, fundi unremarkable. CN III, IV, VI:  full range of motion, no nystagmus, no ptosis CN V: facial sensation intact CN VII: upper and lower face symmetric CN VIII: hearing intact to finger rub CN IX, X: gag intact, uvula midline CN XI: sternocleidomastoid and trapezius muscles intact CN  XII: tongue midline Bulk & Tone: normal, no fasciculations. Motor: 5/5 throughout with no pronator drift. Sensation: intact to light touch, cold, pin, vibration and joint position sense.  No extinction to double simultaneous stimulation.  Romberg test negative Deep Tendon Reflexes: +2 throughout, no ankle clonus Plantar responses: downgoing bilaterally Cerebellar: no incoordination on finger to nose, heel to shin. No dysdiadochokinesia Gait: narrow-based and steady, able to tandem walk adequately. Tremor: none  IMPRESSION: This is a very pleasant 22 year old right-handed woman with a history of anxiety and depression, presenting for evaluation of a 3-year history of recurrent stereotyped episodes triggered by flashing lights and quick movements, where she feels an out of body sensation followed by dizziness. The episodes can last for several hours but she is able to function normally. Her neurological exam is normal. Etiology of symptoms is unclear. The stereotyped nature raises the possibility of seizures, however the duration of symptoms is atypical for seizure. A 1-hour sleep-deprived EEG will be ordered. If normal, we will do a 48-hour EEG to capture and classify her symptoms. We discussed that anxiety can also potentially cause her symptoms, particularly since the episodes quieted down when she was taking Zoloft. [place] driving laws were discussed with the patient, and she knows to stop driving after an episode of loss of awareness/consciousness, until 6 months event-free. She will follow-up after the EEG and knows to call for any changes.   Thank you for allowing me to participate in the care of this patient. Please do not hesitate to call for any questions or concerns.   Patrcia DollyKaren Crystalmarie Yasin, M.D.  CC: Dr. Beverely Lowabori

## 2018-05-12 NOTE — Patient Instructions (Signed)
Great meeting you! 1. Schedule 1-hour sleep-deprived EEG. If normal, we will plan for a 48-hour EEG. 2. Follow-up after EEG 3. If any change in symptoms (loss of awareness/consciousness), per [place] driving laws, no driving for 6 months

## 2018-05-18 ENCOUNTER — Ambulatory Visit (INDEPENDENT_AMBULATORY_CARE_PROVIDER_SITE_OTHER): Payer: Managed Care, Other (non HMO) | Admitting: Neurology

## 2018-05-18 DIAGNOSIS — F419 Anxiety disorder, unspecified: Secondary | ICD-10-CM

## 2018-05-18 DIAGNOSIS — R404 Transient alteration of awareness: Secondary | ICD-10-CM | POA: Diagnosis not present

## 2018-05-18 NOTE — Procedures (Signed)
ELECTROENCEPHALOGRAM REPORT  Date of Study: 05/18/2018  Patient's Name: Stefanie Farrell MRN: [id number] Date of Birth: [birth year]  Referring Provider: Dr. Patrcia DollyKaren Aquino  Clinical History: This is a 22 year old woman with recurrent stereotyped episodes triggered by flashing lights and quick movements where she has an out of body sensation followed by dizziness. EEG for classification.  Medications: Prozac, Junel, Desyrel  Technical Summary: A multichannel digital 1-hour sleep-deprived EEG recording measured by the international 10-20 system with electrodes applied with paste and impedances below 5000 ohms performed in our laboratory with EKG monitoring in an awake and asleep patient.  Hyperventilation and photic stimulation were performed.  The digital EEG was referentially recorded, reformatted, and digitally filtered in a variety of bipolar and referential montages for optimal display.    Description: The patient is awake and asleep during the recording.  During maximal wakefulness, there is a symmetric, medium voltage 10.5 Hz posterior dominant rhythm that attenuates with eye opening.  The record is symmetric.  During drowsiness and sleep, there is an increase in theta and delta slowing of the background.  Vertex waves and symmetric sleep spindles were seen.  Hyperventilation and photic stimulation did not elicit any abnormalities. No symptoms reported during photic stimulation. There were no epileptiform discharges or electrographic seizures seen.    EKG lead was unremarkable.  Impression: This 1-hour awake and asleep EEG is normal.    Clinical Correlation: A normal EEG does not exclude a clinical diagnosis of epilepsy.  If further clinical questions remain, prolonged EEG may be helpful.  Clinical correlation is advised.   Patrcia DollyKaren Aquino, M.D.

## 2018-05-20 ENCOUNTER — Other Ambulatory Visit: Payer: Self-pay

## 2018-05-20 ENCOUNTER — Encounter: Payer: Self-pay | Admitting: Family Medicine

## 2018-05-20 ENCOUNTER — Ambulatory Visit: Payer: Managed Care, Other (non HMO) | Admitting: Family Medicine

## 2018-05-20 VITALS — BP 118/81 | HR 81 | Temp 98.1°F | Resp 16 | Ht 69.0 in | Wt 252.2 lb

## 2018-05-20 DIAGNOSIS — F329 Major depressive disorder, single episode, unspecified: Secondary | ICD-10-CM | POA: Diagnosis not present

## 2018-05-20 DIAGNOSIS — F419 Anxiety disorder, unspecified: Secondary | ICD-10-CM | POA: Diagnosis not present

## 2018-05-20 DIAGNOSIS — F32A Depression, unspecified: Secondary | ICD-10-CM

## 2018-05-20 MED ORDER — BUSPIRONE HCL 5 MG PO TABS: 5.0000 mg | ORAL_TABLET | Freq: Two times a day (BID) | ORAL | 3 refills | Status: DC

## 2018-05-20 NOTE — Progress Notes (Signed)
   Subjective:    Patient ID: Stefanie Farrell, female    DOB: Nov 30, 1995, 22 y.o.   MRN: [id number]  HPI Anxiety/depression- pt reports mood and energy levels are good on Prozac 10mg .  'I love this medicine.  Everyone loves it'.  'my anxiety is really bad'.  Has had dizziness/confusion episodes- Neuro w/u thus far has been negative.  Has ambulatory EEG scheduled.  When this occurs, body will become numb and tingly- these sxs completely resolved w/ Zoloft previously.  Has been on Lexapro, Zoloft, Wellbutrin.  Pt is unable to drive to work.   Review of Systems For ROS see HPI     Objective:   Physical Exam  Constitutional: She is oriented to person, place, and time. She appears well-developed and well-nourished. No distress.  HENT:  Head: Normocephalic and atraumatic.  Neurological: She is alert and oriented to person, place, and time.  Skin: Skin is warm and dry.  Psychiatric: She has a normal mood and affect. Her behavior is normal. Thought content normal.  Vitals reviewed.         Assessment & Plan:

## 2018-05-20 NOTE — Patient Instructions (Signed)
Follow up in 4-6 weeks to recheck anxiety CONTINUE the Prozac once daily ADD the Buspirone twice daily Call with any questions or concerns Enjoy the rest of your summer!!!

## 2018-05-20 NOTE — Assessment & Plan Note (Signed)
Depression is much better since starting Prozac 10mg  daily.  She continues to struggle w/ anxiety and feels her neuro episodes are directly related to anxiety.  Will add Buspar 5mg  BID and monitor for improvement.  Will follow.

## 2018-05-21 ENCOUNTER — Telehealth: Payer: Self-pay

## 2018-05-21 NOTE — Telephone Encounter (Signed)
-----   Message from Van ClinesKaren M Aquino, MD sent at 05/18/2018  1:11 PM EDT ----- Pls let patient and mother know the EEG was normal, did she have any symptoms when she did the bright flashing lights? Proceed with 48-hour EEG as discussed. Thanks

## 2018-05-21 NOTE — Telephone Encounter (Signed)
Spoke with pt relaying message below.  She states that the flashing lights "really bothered" her and made her feel confused.  48 hour EEG scheduled for Monday, August 12

## 2018-05-25 ENCOUNTER — Ambulatory Visit (INDEPENDENT_AMBULATORY_CARE_PROVIDER_SITE_OTHER): Payer: Managed Care, Other (non HMO) | Admitting: Neurology

## 2018-05-25 DIAGNOSIS — R404 Transient alteration of awareness: Secondary | ICD-10-CM | POA: Diagnosis not present

## 2018-05-25 DIAGNOSIS — F419 Anxiety disorder, unspecified: Secondary | ICD-10-CM

## 2018-05-28 ENCOUNTER — Encounter: Payer: Self-pay | Admitting: Family Medicine

## 2018-06-01 NOTE — Telephone Encounter (Signed)
Patient's mom would like Ladona Ridgelaylor to come by the office and pick up the paper that was mailed out on 8/15 because she is ready to get the ball rolling. Please call back when ready for pick up. [phone]. She is wanting to try to contact some of the doctors today.

## 2018-06-02 ENCOUNTER — Telehealth: Payer: Self-pay | Admitting: Emergency Medicine

## 2018-06-02 NOTE — Telephone Encounter (Signed)
Unfortunately, we are not able to place psychiatry referrals (they do not accept them due to high no-show rates).  I am able to switch her from Prozac to Zoloft 25mg  daily but pt had stopped this in the past.  So there are a couple of things we can do: 1) we can increase the Prozac.  The dose is VERY low and it may be enough to help depression but not anxiety 2) we can switch back to the Zoloft and monitor closely- increasing the dose as needed 3) given the difficulty that we are having finding a medication/s that are able to control symptoms, we could consider Genesight testing (a cheek swab that identifies which specific medications work with someone's genetic makeup/metabolism for best results)  Ultimately, it would be best for them to schedule a visit for Stefanie Farrell to discuss/make changes

## 2018-06-02 NOTE — Telephone Encounter (Signed)
Called and spoke with patient mom. She advised that patient is having a really hard time lately. Mom states the patient is doing amazing on the prozac. However, her anxiety when she is driving or coming to an intersection has increased tremendously. Patient is no longer driving and her mom and dad are driving her to school and to work. Mom does not feel as though the new buspar medication started 05/20/18.   Pt had been on zoloft in the past and pt mom was wondering if you might be willing to put pt back on this? Patient has advised her mom that this has been the only medication that has seemed to take away the anxiety completely. Pt has called Dr. Sharl MaKerr office and the first opening they have is in December. Pt mom was wondering if we placed a referral could pt be seen sooner?

## 2018-06-02 NOTE — Telephone Encounter (Signed)
Copied from CRM [id number]. Topic: General - Other >> Jun 02, 2018  7:40 AM Debroah LoopLander, Lumin L wrote: Reason for CRM: Patient mom, Henrene Pastorickol, calling to request a psychiatrist and medication questions.  On DPR.

## 2018-06-02 NOTE — Telephone Encounter (Signed)
Called and spoke with both pt and her mom. They are agreeable to coming in. Appt made for Wednesday at 2pm.

## 2018-06-02 NOTE — Telephone Encounter (Unsigned)
Copied from CRM #[id number]. Topic: General - Other >> Jun 02, 2018  7:40 AM Lander, Lumin L wrote: Reason for CRM: Patient mom, Nickol, calling to request a psychiatrist and medication questions.  On DPR. 

## 2018-06-03 ENCOUNTER — Ambulatory Visit: Payer: Managed Care, Other (non HMO) | Admitting: Family Medicine

## 2018-06-03 ENCOUNTER — Encounter: Payer: Self-pay | Admitting: Family Medicine

## 2018-06-03 ENCOUNTER — Other Ambulatory Visit: Payer: Self-pay

## 2018-06-03 VITALS — BP 122/78 | HR 95 | Temp 99.1°F | Resp 17 | Ht 69.0 in | Wt 252.2 lb

## 2018-06-03 DIAGNOSIS — F329 Major depressive disorder, single episode, unspecified: Secondary | ICD-10-CM

## 2018-06-03 DIAGNOSIS — F419 Anxiety disorder, unspecified: Secondary | ICD-10-CM | POA: Diagnosis not present

## 2018-06-03 MED ORDER — SERTRALINE HCL 25 MG PO TABS: 25.0000 mg | ORAL_TABLET | Freq: Every day | ORAL | 3 refills | Status: DC

## 2018-06-03 NOTE — Patient Instructions (Signed)
Follow up in 1 month- sooner if needed We'll notify you of your GeneSight results when they are available Continue the Prozac 10mg  ADD the Zoloft (Sertraline) 25mg  daily I do NOT think that Serotonin Syndrome will be an issue at these very low doses but the information is listed below Call with any questions or concerns Hang in there!!!  Serotonin Syndrome Serotonin is a brain chemical that regulates the nervous system, which includes the brain, spinal cord, and nerves. Serotonin appears to play a role in all types of behavior, including appetite, emotions, movement, thinking, and response to stress. Excessively high levels of serotonin in the body can cause serotonin syndrome, which is a very dangerous condition. What are the causes? This condition can be caused by taking medicines or drugs that increase the level of serotonin in your body. These include:  Antidepressant medicines.  Migraine medicines.  Certain pain medicines.  Certain recreational drugs, including ecstasy, LSD, cocaine, and amphetamines.  Over-the-counter cough or cold medicines that contain dextromethorphan.  Certain herbal supplements, including St. John's wort, ginseng, and nutmeg.  This condition usually occurs when you take these medicines or drugs in combination, but it can also happen with a high dose of a single medicine or drug. What increases the risk? This condition is more likely to develop in:  People who have recently increased the dosage of medicine that increases the serotonin level.  People who just started taking medicine that increases the serotonin level.  What are the signs or symptoms? Symptoms of this condition usually happens within several hours of a medicine change. Symptoms include:  Headache.  Muscle twitching or stiffness.  Diarrhea.  Confusion.  Restlessness or agitation.  Shivering or goose bumps.  Loss of muscle coordination.  Rapid heart rate.  Sweating.  Severe  cases of serotonin syndromecan cause:  Irregular heartbeat.  Seizures.  Loss of consciousness.  High fever.  How is this diagnosed? This condition is diagnosed with a medical history and physical exam. You will be asked aboutyour symptoms and your use of medicines and recreational drugs. Your health care provider may also order lab work or additional tests to rule out other causes of your symptoms. How is this treated? The treatment for this condition depends on the severity of your symptoms. For mild cases, stopping the medicine that caused your condition is usually all that is needed. For moderate to severe cases, hospitalization is required to monitor you and to prevent further muscle damage. Follow these instructions at home:  Take over-the-counter and prescription medicines only as told by your health care provider. This is important.  Check with your health care provider before you start taking any new prescriptions, over-the-counter medicines, herbs, or supplements.  Avoid combining any medicines that can cause this condition to occur.  Keep all follow-up visits as told by your health care provider.This is important.  Maintain a healthy lifestyle. ? Eat healthy foods. ? Get plenty of sleep. ? Exercise regularly. ? Do not drink alcohol. ? Do not use recreational drugs. Contact a health care provider if:  Medicines do not seem to be helping.  Your symptoms do not improve or they get worse.  You have trouble taking care of yourself. Get help right away if:  You have worsening confusion, severe headache, chest pain, high fever, seizures, or loss of consciousness.  You have serious thoughts about hurting yourself or others.  You experience serious side effects of medicine, such as swelling of your face, lips, tongue, or throat.  This information is not intended to replace advice given to you by your health care provider. Make sure you discuss any questions you have  with your health care provider. Document Released: 11/07/2004 Document Revised: 05/25/2016 Document Reviewed: 10/13/2014 Elsevier Interactive Patient Education  [employer]2018 Elsevier Inc.

## 2018-06-03 NOTE — Progress Notes (Signed)
   Subjective:    Patient ID: Stefanie Farrell, female    DOB: 05/31/1996, 22 y.o.   MRN: [id number]  HPI Depression- pt feels depression is well managed but 'every day all day for the last 3 weeks' she has 'a really strange feeling'.  She has been googling and what she is describing is dissociation.  Pt reports the Zoloft 'took it all away'.  Mom is concerned b/c the Prozac 'has brought back the old Stefanie Farrell' and doesn't want to lose that.  Unable to drive due to high anxiety.     Review of Systems For ROS see HPI     Objective:   Physical Exam  Constitutional: She is oriented to person, place, and time. She appears well-developed and well-nourished. No distress.  Neurological: She is alert and oriented to person, place, and time.  Skin: Skin is warm and dry.  Psychiatric: She has a normal mood and affect. Her behavior is normal. Thought content normal.  Vitals reviewed.         Assessment & Plan:

## 2018-06-03 NOTE — Assessment & Plan Note (Signed)
Deteriorated.  Pt feels she is having dissociative episodes when her anxiety ramps up.  Previously Zoloft improved these sxs but did not help her mood.  Prozac helps mood but not the anxiety.  Had long discussion w/ both pt and mom that while not conventional, we could use both medications at very low doses to improve both mood and anxiety.  Reviewed risk of serotonin syndrome and provided information on sxs.  Also did GeneSight to help guide further management if dual tx is ineffective.  Pt expressed understanding and is in agreement w/ plan.

## 2018-06-09 NOTE — Procedures (Signed)
ELECTROENCEPHALOGRAM REPORT  Dates of Recording: 05/25/2018 11:49AM to 05/27/2018 11:41AM  Patient's Name: Stefanie Farrell MRN: [id number] Date of Birth: 06-Oct-1996  Referring Provider: Dr. Patrcia DollyKaren Aquino  Procedure: 48-hour ambulatory EEG  History: This is a 22 year old woman with recurrent episodes of out of body sensation followed by dizziness that are usually triggered by flashing lights and quick movements.  Medications:  PROZAC 10 MG tablet  JUNEL 1/20 1-20 MG-MCG tablet  DESYREL 50 MG tablet   Technical Summary: This is a 48-hour multichannel digital EEG recording measured by the international 10-20 system with electrodes applied with paste and impedances below 5000 ohms performed as portable with EKG monitoring.  The digital EEG was referentially recorded, reformatted, and digitally filtered in a variety of bipolar and referential montages for optimal display.    DESCRIPTION OF RECORDING: During maximal wakefulness, the background activity consisted of a symmetric 11 Hz posterior dominant rhythm which was reactive to eye opening.  There were no epileptiform discharges or focal slowing seen in wakefulness.  During the recording, the patient progresses through wakefulness, drowsiness, and Stage 2 sleep.  Again, there were no epileptiform discharges seen.  Events: On 08/12 at 2017 hours, she has a dizzy, weird, disoriented feeling with flashing lights. Electrographically, there were no EEG or EKG changes seen.  On 08/13 at 1110 hours, she has a weird indescribable out of body feeling. Electrographically, there were no EEG changes seen. HR was 126 bpm.   There were no electrographic seizures seen.  EKG lead was unremarkable with occasional periods of sinus tachycardia up to 132 bpm.  IMPRESSION: This 48-hour ambulatory EEG study is normal.  Episodes of dizzy, weird, disoriented, out of body feeling did not show any epileptiform correlate.  CLINICAL CORRELATION:  Episodes captured  did not show any epileptiform correlate. Although simple partial seizures can have negative scalp EEG correlate, this is less likely with normal baseline EEG. If further clinical questions remain, inpatient video EEG monitoring may be helpful.   Patrcia DollyKaren Aquino, M.D.

## 2018-06-11 ENCOUNTER — Telehealth: Payer: Self-pay | Admitting: Emergency Medicine

## 2018-06-11 NOTE — Telephone Encounter (Signed)
Copied from CRM [id number]. Topic: General - Other >> Jun 11, 2018  2:55 PM Tamela OddiHarris, Brenda J wrote: Reason for CRM: Patient's mom called to leave a message for Dr. Beverely Lowabori or the nurse to ask if the gene cite test results have come back yet.  Also wanted to let the doctor know to take patient off of the Zoloft because it depresses her and makes her cry uncontrollably.  Please advise.  CB# [phone].

## 2018-06-11 NOTE — Telephone Encounter (Signed)
This happened previously w/ the Zoloft and the depression.  We are checking the company portal for results [facility]

## 2018-06-11 NOTE — Telephone Encounter (Signed)
I will look in the portal.

## 2018-06-12 MED ORDER — DESVENLAFAXINE SUCCINATE ER 25 MG PO TB24: 1.0000 | ORAL_TABLET | Freq: Every day | ORAL | 3 refills | Status: DC

## 2018-06-12 NOTE — Addendum Note (Signed)
Addended by: Geannie RisenBRODMERKEL, JESSICA L on: 06/12/2018 09:07 AM   Modules accepted: Orders

## 2018-06-12 NOTE — Telephone Encounter (Signed)
After reviewing her results last night, it appears we should switch her from the Sertraline and Prozac to Pristiq 25mg  daily (we may need to increase this in the future but this is a good starting dose).  #30, 3 refills.  STOP both current meds, START new med once daily

## 2018-06-12 NOTE — Telephone Encounter (Signed)
Spoke with pt mom and advised of genesight results. New medication was filled to pharmacy and Genesight test results were placed at the front desk for pick up. Pt dad will be by today.

## 2018-07-01 ENCOUNTER — Ambulatory Visit: Payer: Managed Care, Other (non HMO) | Admitting: Family Medicine

## 2018-07-01 DIAGNOSIS — Z0289 Encounter for other administrative examinations: Secondary | ICD-10-CM

## 2018-07-21 ENCOUNTER — Encounter: Payer: Self-pay | Admitting: Family Medicine

## 2018-07-21 MED ORDER — DESVENLAFAXINE SUCCINATE ER 50 MG PO TB24: 50.0000 mg | ORAL_TABLET | Freq: Every day | ORAL | 3 refills | Status: DC

## 2018-07-27 ENCOUNTER — Other Ambulatory Visit: Payer: Self-pay | Admitting: Family Medicine

## 2018-08-07 ENCOUNTER — Ambulatory Visit: Payer: Self-pay | Admitting: Family Medicine

## 2018-09-22 ENCOUNTER — Other Ambulatory Visit: Payer: Self-pay | Admitting: Family Medicine

## 2018-11-10 LAB — HM COLONOSCOPY

## 2018-11-17 ENCOUNTER — Encounter: Payer: Self-pay | Admitting: General Practice

## 2018-11-21 ENCOUNTER — Other Ambulatory Visit: Payer: Self-pay | Admitting: Family Medicine

## 2019-01-06 ENCOUNTER — Other Ambulatory Visit: Payer: Self-pay | Admitting: Family Medicine

## 2019-03-02 ENCOUNTER — Telehealth: Payer: Self-pay | Admitting: Family Medicine

## 2019-03-02 NOTE — Telephone Encounter (Signed)
Copied from CRM [id number]. Topic: General - Inquiry >> Mar 02, 2019  3:14 PM Deborha Payment wrote: Reason for CRM: Patient mother(Nicole) is calling regarding her daughter(Patient) was bit by a tick yesterday. Mother does have the tick, "lonestar tick" "white tick" Mother would like to know if she knows need to come in regarding this issues.  [phone]

## 2019-03-02 NOTE — Telephone Encounter (Signed)
Please advise if pt needs office visit

## 2019-03-03 NOTE — Telephone Encounter (Signed)
Can this be a VV

## 2019-03-03 NOTE — Telephone Encounter (Signed)
We do not have the capability of testing the tick.  Lonestar ticks do NOT carry Lyme Disease so this is not something to worry about.  They can cause something called STARI but this almost always has a rash.  It can also be associated w/ fatigue, HA, fever, and joint pains.  But if asymptomatic, this is not cause for concern.

## 2019-03-03 NOTE — Telephone Encounter (Signed)
Please advise 

## 2019-03-03 NOTE — Telephone Encounter (Signed)
Can you please schedule?

## 2019-03-03 NOTE — Telephone Encounter (Signed)
Spoke with pt mother, she states that tick bite was in crease of leg inner thigh and would not be able to show area on camera. Mom stated that area look good, but concerned with long term affect with lyme disease. Mom asking if tick can be tested. Mom states that pt is showing no signs of fever or fatigue,

## 2019-03-03 NOTE — Telephone Encounter (Signed)
called and left a detailed message with pt mom to advise of PCP notes and recommendations.

## 2019-03-03 NOTE — Telephone Encounter (Signed)
Yes, please schedule for VV to discuss bite and what to look for

## 2019-03-25 ENCOUNTER — Ambulatory Visit (INDEPENDENT_AMBULATORY_CARE_PROVIDER_SITE_OTHER): Payer: Managed Care, Other (non HMO) | Admitting: Family Medicine

## 2019-03-25 ENCOUNTER — Encounter: Payer: Self-pay | Admitting: Family Medicine

## 2019-03-25 ENCOUNTER — Other Ambulatory Visit: Payer: Self-pay

## 2019-03-25 DIAGNOSIS — Z0000 Encounter for general adult medical examination without abnormal findings: Secondary | ICD-10-CM | POA: Diagnosis not present

## 2019-03-25 DIAGNOSIS — E669 Obesity, unspecified: Secondary | ICD-10-CM | POA: Diagnosis not present

## 2019-03-25 NOTE — Progress Notes (Signed)
I have discussed the procedure for the virtual visit with the patient who has given consent to proceed with assessment and treatment.   Jessica L Brodmerkel, CMA     

## 2019-03-25 NOTE — Progress Notes (Signed)
   Virtual Visit via Video   I connected with patient on 03/25/19 at  2:40 PM EDT by a video enabled telemedicine application and verified that I am speaking with the correct person using two identifiers.  Location patient: Home Location provider: Acup[facility]ist, Office Persons participating in the virtual visit: Patient, Provider, [place] (Jess B)  I discussed the limitations of evaluation and management by telemedicine and the availability of in person appointments. The patient expressed understanding and agreed to proceed.  Subjective:   HPI:   CPE- UTD on immunizations, due to start paps next year.  Was able to finish online semester- grades were good.  Living at home.  Things are going well.  Continues to have good relationship w/ parents.  Is now engaged.  Continues to work at [employer].  Not smoking of vaping.  No drug use.  No alcohol.    ROS:  Patient reports no vision/ hearing changes, adenopathy,fever, weight change,  persistant/recurrent hoarseness , swallowing issues, chest pain, palpitations, edema, persistant/recurrent cough, hemoptysis, dyspnea (rest/exertional/paroxysmal nocturnal), gastrointestinal bleeding (melena, rectal bleeding), abdominal pain, significant heartburn, bowel changes, GU symptoms (dysuria, hematuria, incontinence), Gyn symptoms (abnormal  bleeding, pain),  syncope, focal weakness, memory loss, numbness & tingling, skin/hair/nail changes, abnormal bruising or bleeding, anxiety, or depression.   Patient Active Problem List   Diagnosis Date Noted  . Birth control counseling 04/08/2017  . Anxiety and depression 09/03/2016    Social History   Tobacco Use  . Smoking status: Never Smoker  . Smokeless tobacco: Never Used  Substance Use Topics  . Alcohol use: No    Current Outpatient Medications:  .  desvenlafaxine (PRISTIQ) 50 MG 24 hr tablet, Take 1 tablet (50 mg total) by mouth daily., Disp: 30 tablet, Rfl: 3 .  JUNEL 1/20 1-20 MG-MCG tablet, TAKE  ONE TABLET BY MOUTH DAILY, Disp: 63 tablet, Rfl: 0 .  Vilazodone HCl (VIIBRYD) 20 MG TABS, Take by mouth., Disp: , Rfl:   Allergies  Allergen Reactions  . Methylprednisolone Hives  . Lexapro [Escitalopram Oxalate] Other (See Comments)    Caused severe mood swings  . Penicillin G Rash    Objective:   Pulse 88   Ht 5\' 9"  (1.753 m)   Wt 248 lb (112.5 kg)   BMI 36.62 kg/m   AAOx3, NAD NCAT, EOMI No obvious CN deficits Coloring WNL Pt is able to speak clearly, coherently without shortness of breath or increased work of breathing.  Thought process is linear.  Mood is appropriate.   Assessment and Plan:   CPE- pt is doing very well and is UTD on immunizations.  Not due to start paps until next year.  Encouraged healthy diet and regular exercise.  Anticipatory guidance provided.    Annye Asa, MD 03/25/2019

## 2019-03-30 ENCOUNTER — Telehealth: Payer: Self-pay | Admitting: Family Medicine

## 2019-03-30 NOTE — Telephone Encounter (Signed)
LM asking pt to call back to schedule her cpe for next June.

## 2019-04-01 ENCOUNTER — Emergency Department (HOSPITAL_COMMUNITY): Payer: Managed Care, Other (non HMO)

## 2019-04-01 ENCOUNTER — Encounter (HOSPITAL_COMMUNITY): Payer: Self-pay

## 2019-04-01 ENCOUNTER — Other Ambulatory Visit: Payer: Self-pay

## 2019-04-01 ENCOUNTER — Emergency Department (HOSPITAL_COMMUNITY)
Admission: EM | Admit: 2019-04-01 | Discharge: 2019-04-02 | Disposition: A | Discharge status: Home / Self Care | Payer: Managed Care, Other (non HMO) | Attending: Emergency Medicine | Admitting: Emergency Medicine

## 2019-04-01 DIAGNOSIS — R1032 Left lower quadrant pain: Secondary | ICD-10-CM | POA: Diagnosis present

## 2019-04-01 DIAGNOSIS — Z79899 Other long term (current) drug therapy: Secondary | ICD-10-CM | POA: Diagnosis not present

## 2019-04-01 DIAGNOSIS — Z91040 Latex allergy status: Secondary | ICD-10-CM | POA: Diagnosis not present

## 2019-04-01 DIAGNOSIS — N201 Calculus of ureter: Secondary | ICD-10-CM | POA: Diagnosis not present

## 2019-04-01 LAB — CBC WITH DIFFERENTIAL/PLATELET
Abs Immature Granulocytes: 0.03 10*3/uL (ref 0.00–0.07)
Basophils Absolute: 0.1 10*3/uL (ref 0.0–0.1)
Basophils Relative: 1 %
Eosinophils Absolute: 0.3 10*3/uL (ref 0.0–0.5)
Eosinophils Relative: 3 %
HCT: 41.2 % (ref 36.0–46.0)
Hemoglobin: 13.2 g/dL (ref 12.0–15.0)
Immature Granulocytes: 0 %
Lymphocytes Relative: 32 %
Lymphs Abs: 2.7 10*3/uL (ref 0.7–4.0)
MCH: 30 pg (ref 26.0–34.0)
MCHC: 32 g/dL (ref 30.0–36.0)
MCV: 93.6 fL (ref 80.0–100.0)
Monocytes Absolute: 0.6 10*3/uL (ref 0.1–1.0)
Monocytes Relative: 7 %
Neutro Abs: 4.8 10*3/uL (ref 1.7–7.7)
Neutrophils Relative %: 57 %
Platelets: 296 10*3/uL (ref 150–400)
RBC: 4.4 MIL/uL (ref 3.87–5.11)
RDW: 12.1 % (ref 11.5–15.5)
WBC: 8.6 10*3/uL (ref 4.0–10.5)
nRBC: 0 % (ref 0.0–0.2)

## 2019-04-01 LAB — HEPATIC FUNCTION PANEL
ALT: 14 U/L (ref 0–44)
AST: 20 U/L (ref 15–41)
Albumin: 3.8 g/dL (ref 3.5–5.0)
Alkaline Phosphatase: 54 U/L (ref 38–126)
Bilirubin, Direct: <0.1 mg/dL (ref 0.0–0.2)
Total Bilirubin: 0.3 mg/dL (ref 0.3–1.2)
Total Protein: 7 g/dL (ref 6.5–8.1)

## 2019-04-01 LAB — BASIC METABOLIC PANEL
Anion gap: 9 (ref 5–15)
BUN: 11 mg/dL (ref 6–20)
CO2: 24 mmol/L (ref 22–32)
Calcium: 9.4 mg/dL (ref 8.9–10.3)
Chloride: 106 mmol/L (ref 98–111)
Creatinine, Ser: 1.02 mg/dL — ABNORMAL HIGH (ref 0.44–1.00)
GFR calc Af Amer: >60 mL/min (ref >60)
GFR calc non Af Amer: >60 mL/min (ref >60)
Glucose, Bld: 115 mg/dL — ABNORMAL HIGH (ref 70–99)
Potassium: 3.5 mmol/L (ref 3.5–5.1)
Sodium: 139 mmol/L (ref 135–145)

## 2019-04-01 LAB — URINALYSIS, ROUTINE W REFLEX MICROSCOPIC
Bilirubin Urine: NEGATIVE
Glucose, UA: NEGATIVE mg/dL
Ketones, ur: NEGATIVE mg/dL
Nitrite: NEGATIVE
Protein, ur: 30 mg/dL — AB
RBC / HPF: >50 RBC/hpf — ABNORMAL HIGH (ref 0–5)
Specific Gravity, Urine: 1.023 (ref 1.005–1.030)
pH: 6 (ref 5.0–8.0)

## 2019-04-01 LAB — I-STAT BETA HCG BLOOD, ED (MC, WL, AP ONLY): I-stat hCG, quantitative: <5 m[IU]/mL (ref <5)

## 2019-04-01 LAB — LIPASE, BLOOD: Lipase: 26 U/L (ref 11–51)

## 2019-04-01 LAB — WET PREP, GENITAL
Clue Cells Wet Prep HPF POC: NONE SEEN
Sperm: NONE SEEN
Trich, Wet Prep: NONE SEEN
Yeast Wet Prep HPF POC: NONE SEEN

## 2019-04-01 MED ORDER — KETOROLAC TROMETHAMINE 15 MG/ML IJ SOLN
15.0000 mg | Freq: Once | INTRAMUSCULAR | Status: AC
  Administered 2019-04-01: 15 mg via INTRAVENOUS
  Filled 2019-04-01: qty 1

## 2019-04-01 MED ORDER — ONDANSETRON HCL 4 MG/2ML IJ SOLN
4.0000 mg | Freq: Once | INTRAMUSCULAR | Status: AC
  Administered 2019-04-01: 4 mg via INTRAVENOUS
  Filled 2019-04-01: qty 2

## 2019-04-01 MED ORDER — HYDROMORPHONE HCL 1 MG/ML IJ SOLN
1.0000 mg | Freq: Once | INTRAMUSCULAR | Status: AC
  Administered 2019-04-01: 1 mg via INTRAVENOUS
  Filled 2019-04-01: qty 1

## 2019-04-01 MED ORDER — MORPHINE SULFATE (PF) 4 MG/ML IV SOLN
4.0000 mg | Freq: Once | INTRAVENOUS | Status: AC
  Administered 2019-04-01: 4 mg via INTRAVENOUS
  Filled 2019-04-01: qty 1

## 2019-04-01 NOTE — ED Triage Notes (Signed)
Pt presents to the ED with left sided flank pain x1 hour. She reports mild nausea without emesis. Pt denies urinary symptoms.

## 2019-04-01 NOTE — ED Provider Notes (Signed)
Stefanie Farrell [facility] Provider Note   CSN: [id number] Arrival date & time: 04/01/19  2140    History   Chief Complaint Chief Complaint  Patient presents with  . Flank Pain    HPI Stefanie Farrell is a 23 y.o. female.     The history is provided by the patient and medical records.  Abdominal Pain Pain location:  LLQ Pain quality: aching, cramping, dull and heavy   Pain radiates to:  Groin and L flank Pain severity:  Severe Onset quality:  Sudden Duration:  1 hour Timing:  Constant Progression:  Unchanged Chronicity:  New Context: not awakening from sleep, not eating, not previous surgeries, not sick contacts, not suspicious food intake and not trauma   Relieved by:  Nothing Worsened by:  Position changes, palpation and movement Ineffective treatments:  Not moving and lying down Associated symptoms: nausea and vomiting   Associated symptoms: no constipation, no diarrhea, no dysuria, no fever, no hematemesis, no hematochezia, no hematuria, no melena, no shortness of breath, no vaginal bleeding and no vaginal discharge   Risk factors: obesity   Risk factors: not elderly, has not had multiple surgeries and no NSAID use     Past Medical History:  Diagnosis Date  . Anxiety   . Depression     Patient Active Problem List   Diagnosis Date Noted  . Physical exam 03/25/2019  . Obesity (BMI 30-39.9) 03/25/2019  . Birth control counseling 04/08/2017  . Anxiety and depression 09/03/2016    Past Surgical History:  Procedure Laterality Date  . TONSILLECTOMY AND ADENOIDECTOMY       OB History   No obstetric history on file.      Home Medications    Prior to Admission medications   Medication Sig Start Date End Date Taking? Authorizing Provider  desvenlafaxine (PRISTIQ) 50 MG 24 hr tablet Take 1 tablet (50 mg total) by mouth daily. Patient taking differently: Take 50 mg by mouth at bedtime.  07/21/18  Yes Sheliah Hatchabori, Katherine E, MD  JUNEL  1/20 1-20 MG-MCG tablet TAKE ONE TABLET BY MOUTH DAILY Patient taking differently: Take 1 tablet by mouth at bedtime. TAKE CONTINUOUSLY 01/07/19  Yes Sheliah Hatchabori, Katherine E, MD  Vilazodone HCl (VIIBRYD) 20 MG TABS Take 20 mg by mouth at bedtime.    Yes [provider]    Family History History reviewed. No pertinent family history.  Social History Social History   Tobacco Use  . Smoking status: Never Smoker  . Smokeless tobacco: Never Used  Substance Use Topics  . Alcohol use: No  . Drug use: No     Allergies   Methylprednisolone, Lexapro [escitalopram oxalate], Latex, Penicillin g, and Shellfish-derived products   Review of Systems Review of Systems  Constitutional: Negative for fever.  Respiratory: Negative for shortness of breath.   Gastrointestinal: Positive for abdominal pain, nausea and vomiting. Negative for constipation, diarrhea, hematemesis, hematochezia and melena.  Genitourinary: Negative for dysuria, hematuria, vaginal bleeding and vaginal discharge.  All other systems reviewed and are negative.    Physical Exam Updated Vital Signs BP (!) 145/80 (BP Location: Right Arm)   Pulse (!) 106   Temp 98.5 F (36.9 C) (Oral)   Resp 18   Ht 5\' 9"  (1.753 m)   Wt 111.6 kg   SpO2 100%   BMI 36.33 kg/m   Physical Exam Vitals signs and nursing note reviewed.  Constitutional:      Appearance: She is well-developed.     Comments:  Appears uncomfortable  HENT:     Head: Normocephalic and atraumatic.  Eyes:     Conjunctiva/sclera: Conjunctivae normal.  Neck:     Musculoskeletal: Neck supple.  Cardiovascular:     Rate and Rhythm: Tachycardia present.  Pulmonary:     Effort: No respiratory distress.     Breath sounds: No stridor. No wheezing or rhonchi.  Abdominal:     Palpations: Abdomen is soft.     Tenderness: There is abdominal tenderness. There is no right CVA tenderness or rebound.     Comments: Tenderness to palpation left lower quadrant,  periumbilical with light touch, deep palpation, voluntary guarding  Genitourinary:    Vagina: Vaginal discharge present.     Comments: No cervical motion tenderness present Cervical os closed tenderness present bilaterally on bimanual exam Skin:    General: Skin is warm and dry.  Neurological:     Mental Status: She is alert and oriented to person, place, and time.      ED Treatments / Results  Labs (all labs ordered are listed, but only abnormal results are displayed) Labs Reviewed  WET PREP, GENITAL - Abnormal; Notable for the following components:      Result Value   WBC, Wet Prep HPF POC MODERATE (*)    All other components within normal limits  URINALYSIS, ROUTINE W REFLEX MICROSCOPIC - Abnormal; Notable for the following components:   APPearance CLOUDY (*)    Hgb urine dipstick LARGE (*)    Protein, ur 30 (*)    Leukocytes,Ua SMALL (*)    RBC / HPF >50 (*)    Bacteria, UA RARE (*)    All other components within normal limits  BASIC METABOLIC PANEL - Abnormal; Notable for the following components:   Glucose, Bld 115 (*)    Creatinine, Ser 1.02 (*)    All other components within normal limits  CBC WITH DIFFERENTIAL/PLATELET  HEPATIC FUNCTION PANEL  LIPASE, BLOOD  I-STAT BETA HCG BLOOD, ED ([facility], [facility], [facility] ONLY)  GC/CHLAMYDIA PROBE AMP ([place]) NOT AT [facility]    EKG None  Radiology Ct Abdomen Pelvis Wo Contrast  Result Date: 04/01/2019 CLINICAL DATA:  Acute abdominal pain and distension. Patient reports left flank pain. EXAM: CT ABDOMEN AND PELVIS WITHOUT CONTRAST TECHNIQUE: Multidetector CT imaging of the abdomen and pelvis was performed following the standard protocol without IV contrast. COMPARISON:  None. FINDINGS: Lower chest: Lung bases are clear. Hepatobiliary: No focal liver abnormality is seen. No gallstones, gallbladder wall thickening, or biliary dilatation. Pancreas: No ductal dilatation or inflammation. Spleen: Normal in size without focal abnormality.  Splenule anteriorly. Adrenals/Urinary Tract: Normal adrenal glands. Punctate obstructing stone at the left ureterovesicular junction with mild hydroureteronephrosis and perinephric edema. No additional nonobstructing calculi. No right hydronephrosis. Right ureter is decompressed. Urinary bladder partially distended. Stomach/Bowel: Stomach is within normal limits. Appendix appears normal. No evidence of bowel wall thickening, distention, or inflammatory changes. Vascular/Lymphatic: Unremarkable noncontrast vascular structures. Few prominent ileocolic nodes likely reactive. Reproductive: Uterus and bilateral adnexa are unremarkable. Other: No free air, free fluid, or intra-abdominal fluid collection. Tiny fat containing umbilical hernia. Musculoskeletal: There are no acute or suspicious osseous abnormalities. IMPRESSION: Punctate obstructing stone at the left ureterovesicular junction with mild hydronephrosis. Electronically Signed   By: Narda RutherfordMelanie  Sanford M.D.   On: 04/01/2019 23:13    Procedures Procedures (including critical care time)  Medications Ordered in ED Medications  morphine 4 MG/ML injection 4 mg (4 mg Intravenous Given 04/01/19 2208)  ondansetron (ZOFRAN) injection  4 mg (4 mg Intravenous Given 04/01/19 2207)  HYDROmorphone (DILAUDID) injection 1 mg (1 mg Intravenous Given 04/01/19 2242)  ketorolac (TORADOL) 15 MG/ML injection 15 mg (15 mg Intravenous Given 04/01/19 2320)     Initial Impression / Assessment and Plan / ED Course  I have reviewed the triage vital signs and the nursing notes.  Pertinent labs & imaging results that were available during my care of the patient were reviewed by me and considered in my medical decision making (see chart for details).        Medical Decision Making: Ola Raap is a 23 y.o. female who presented to the ED today with left lower quadrant abdominal pain.  Past medical history significant for anxiety, depression Reviewed and confirmed nursing  documentation for past medical history, family history, social history.  On my initial exam, the pt was appears uncomfortable, heart rate 115 bpm, afebrile, not hypotensive, no increased work of breathing or respiratory distress, normal mental status, GCS 15, following commands.  Patient given pain medication, antiemetic  Patient on birth control however endorses sexual partner, states she uses protection, will obtain pregnancy test to rule out ectopic pregnancy, patient denies any vaginal bleeding Will send swabs given vaginal discharge present on speculum exam, no cervical motion tenderness on exam Ovarian torsion of consideration however no bimanual tenderness on exam No right lower quadrant tenderness concerning for appendicitis Nephrolithiasis versus pyelonephritis are of consideration, will obtain urine studies, CT imaging Mesenteric ischemia considered however given patient's age, no cardiac risk factors, etiology seems less likely  Pregnancy test negative  All radiology and laboratory studies reviewed independently and with my attending physician, agree with reading provided by radiologist unless otherwise noted.  Upon reassessing patient, patient was persistently uncomfortable, will give repeat dose of pain meds Care of patient signed out to oncoming provider at 2300, please see their note for further details.  The above care was discussed with and agreed upon by my attending physician. Emergency Department Medication Summary:  Medications  morphine 4 MG/ML injection 4 mg (4 mg Intravenous Given 04/01/19 2208)  ondansetron (ZOFRAN) injection 4 mg (4 mg Intravenous Given 04/01/19 2207)  HYDROmorphone (DILAUDID) injection 1 mg (1 mg Intravenous Given 04/01/19 2242)  ketorolac (TORADOL) 15 MG/ML injection 15 mg (15 mg Intravenous Given 04/01/19 2320)        Final Clinical Impressions(s) / ED Diagnoses   Final diagnoses:  Abdominal pain, left lower quadrant    ED Discharge Orders     None       Lonzo Candy, MD 04/01/19 2322    Blanchie Dessert, MD 04/01/19 2327

## 2019-04-01 NOTE — ED Notes (Signed)
Pt ambulating to bathroom to provide urine sample

## 2019-04-01 NOTE — ED Provider Notes (Signed)
Patient presents today for evaluation of left-sided flank and abdominal pain.  I assumed care of her at shift change from previous team, please see their note for full H&P.    Physical Exam  BP (!) 145/80 (BP Location: Right Arm)   Pulse (!) 106   Temp 98.5 F (36.9 C) (Oral)   Resp 18   Ht 5\' 9"  (1.753 m)   Wt 111.6 kg   SpO2 100%   BMI 36.33 kg/m   Physical Exam Vitals signs and nursing note reviewed.  Constitutional:      General: She is not in acute distress.    Appearance: She is normal weight.  HENT:     Head: Normocephalic.  Neck:     Musculoskeletal: Normal range of motion.  Cardiovascular:     Rate and Rhythm: Normal rate.  Neurological:     General: No focal deficit present.     Mental Status: She is alert.  Psychiatric:        Mood and Affect: Mood normal.        Behavior: Behavior normal.     ED Course/Procedures     Procedures   Labs Reviewed  WET PREP, GENITAL - Abnormal; Notable for the following components:      Result Value   WBC, Wet Prep HPF POC MODERATE (*)    All other components within normal limits  URINALYSIS, ROUTINE W REFLEX MICROSCOPIC - Abnormal; Notable for the following components:   APPearance CLOUDY (*)    Hgb urine dipstick LARGE (*)    Protein, ur 30 (*)    Leukocytes,Ua SMALL (*)    RBC / HPF >50 (*)    Bacteria, UA RARE (*)    All other components within normal limits  BASIC METABOLIC PANEL - Abnormal; Notable for the following components:   Glucose, Bld 115 (*)    Creatinine, Ser 1.02 (*)    All other components within normal limits  CBC WITH DIFFERENTIAL/PLATELET  HEPATIC FUNCTION PANEL  LIPASE, BLOOD  I-STAT BETA HCG BLOOD, ED ([facility], [facility], [facility] ONLY)  GC/CHLAMYDIA PROBE AMP ([place]) NOT AT [facility]    Ct Abdomen Pelvis Wo Contrast  Result Date: 04/01/2019 CLINICAL DATA:  Acute abdominal pain and distension. Patient reports left flank pain. EXAM: CT ABDOMEN AND PELVIS WITHOUT CONTRAST TECHNIQUE: Multidetector CT  imaging of the abdomen and pelvis was performed following the standard protocol without IV contrast. COMPARISON:  None. FINDINGS: Lower chest: Lung bases are clear. Hepatobiliary: No focal liver abnormality is seen. No gallstones, gallbladder wall thickening, or biliary dilatation. Pancreas: No ductal dilatation or inflammation. Spleen: Normal in size without focal abnormality. Splenule anteriorly. Adrenals/Urinary Tract: Normal adrenal glands. Punctate obstructing stone at the left ureterovesicular junction with mild hydroureteronephrosis and perinephric edema. No additional nonobstructing calculi. No right hydronephrosis. Right ureter is decompressed. Urinary bladder partially distended. Stomach/Bowel: Stomach is within normal limits. Appendix appears normal. No evidence of bowel wall thickening, distention, or inflammatory changes. Vascular/Lymphatic: Unremarkable noncontrast vascular structures. Few prominent ileocolic nodes likely reactive. Reproductive: Uterus and bilateral adnexa are unremarkable. Other: No free air, free fluid, or intra-abdominal fluid collection. Tiny fat containing umbilical hernia. Musculoskeletal: There are no acute or suspicious osseous abnormalities. IMPRESSION: Punctate obstructing stone at the left ureterovesicular junction with mild hydronephrosis. Electronically Signed   By: Narda RutherfordMelanie  Sanford M.D.   On: 04/01/2019 23:13   Medications  morphine 4 MG/ML injection 4 mg (4 mg Intravenous Given 04/01/19 2208)  ondansetron (ZOFRAN) injection 4 mg (4 mg  Intravenous Given 04/01/19 2207)  HYDROmorphone (DILAUDID) injection 1 mg (1 mg Intravenous Given 04/01/19 2242)  ketorolac (TORADOL) 15 MG/ML injection 15 mg (15 mg Intravenous Given 04/01/19 2320)     MDM  Patient is here with a left-sided punctate renal stone.  She has had pain medicine however still remains in pain.  She has a dose of Toradol ordered.  Plan to reevaluate patient approximately 1 hour after Toradol  administration  0103: Patient reevaluated, she reports that her pain has fully resolved and wishes to go home at this time.  She is given prescriptions for Percocet for pain control along with Zofran for nausea.  She is given follow-up with urology.    Return precautions were discussed with patient who states their understanding.  At the time of discharge patient denied any unaddressed complaints or concerns.  Patient is agreeable for discharge home.      Lorin Glass, PA-C 04/02/19 Ardis Hughs, MD 04/11/19 1521

## 2019-04-02 LAB — GC/CHLAMYDIA PROBE AMP (~~LOC~~) NOT AT ARMC
Chlamydia: NEGATIVE
Neisseria Gonorrhea: NEGATIVE

## 2019-04-02 MED ORDER — ONDANSETRON 4 MG PO TBDP: 4.0000 mg | ORAL_TABLET | Freq: Three times a day (TID) | ORAL | 0 refills | Status: DC | PRN

## 2019-04-02 MED ORDER — OXYCODONE-ACETAMINOPHEN 5-325 MG PO TABS: 1.0000 | ORAL_TABLET | Freq: Four times a day (QID) | ORAL | 0 refills | Status: DC | PRN

## 2019-04-02 NOTE — Discharge Instructions (Signed)
Today you received medications that may make you sleepy or impair your ability to make decisions.  For the next 24 hours please do not drive, operate heavy machinery, care for a small child with out another adult present, or perform any activities that may cause harm to you or someone else if you were to fall asleep or be impaired.   You are being prescribed a medication which may make you sleepy. Please follow up of listed precautions for at least 24 hours after taking one dose.  

## 2019-04-11 ENCOUNTER — Other Ambulatory Visit: Payer: Self-pay | Admitting: Family Medicine

## 2019-04-12 MED ORDER — NORETHINDRONE ACET-ETHINYL EST 1-20 MG-MCG PO TABS: 1.0000 | ORAL_TABLET | Freq: Every day | ORAL | 0 refills | Status: DC

## 2019-06-18 ENCOUNTER — Encounter: Payer: Self-pay | Admitting: Family Medicine

## 2019-08-12 ENCOUNTER — Other Ambulatory Visit: Payer: Self-pay | Admitting: Family Medicine

## 2020-01-13 ENCOUNTER — Encounter: Payer: Self-pay | Admitting: Family Medicine

## 2020-01-13 ENCOUNTER — Telehealth (INDEPENDENT_AMBULATORY_CARE_PROVIDER_SITE_OTHER): Payer: Managed Care, Other (non HMO) | Admitting: Family Medicine

## 2020-01-13 VITALS — Wt 255.0 lb

## 2020-01-13 DIAGNOSIS — L989 Disorder of the skin and subcutaneous tissue, unspecified: Secondary | ICD-10-CM

## 2020-01-13 NOTE — Patient Instructions (Signed)
Please schedule an in person exam with your primary doctor in the next 1-2 weeks.  In the interim you may try the lotrimin  twice daily along with topical hydrocortisone cream 1-2 times daily.  Seek care sooner if worsening or other concerns.

## 2020-01-13 NOTE — Progress Notes (Signed)
Virtual Visit via Video Note  I connected with Stefanie Farrell  on 01/13/20 at 11:40 AM EDT by a video enabled telemedicine application and verified that I am speaking with the correct person using two identifiers.  Location patient: home, [place] Location provider:work or home office Persons participating in the virtual visit: patient, provider  I discussed the limitations of evaluation and management by telemedicine and the availability of in person appointments. The patient expressed understanding and agreed to proceed.   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  Past Medical History:  Diagnosis Date  . Anxiety   . Depression     Past Surgical History:  Procedure Laterality Date  . TONSILLECTOMY AND ADENOIDECTOMY      History reviewed. No pertinent family history.  SOCIAL HX: see hpi   Current Outpatient Medications:  .  escitalopram (LEXAPRO) 20 MG tablet, Take 20 mg by mouth daily., Disp: , Rfl:  .  JUNEL 1/20 1-20 MG-MCG tablet, TAKE 1 TABLET BY MOUTH DAILY, Disp: 63 tablet, Rfl: 1  EXAM:  VITALS per patient if applicable:  GENERAL: alert, oriented, appears well and in no acute distress  HEENT: atraumatic, conjunttiva clear, no obvious abnormalities on inspection of external nose and ears  NECK: normal movements of the head and neck  LUNGS: on inspection no signs of respiratory distress, breathing rate appears normal, no obvious gross SOB, gasping or wheezing  SKIN: video image somewhat pixilated impeding visual exam - however can see small circular hyperpig lesion with what  appears to be fine papular lesions along the border  CV: no obvious cyanosis  MS: moves all visible extremities without noticeable abnormality  PSYCH/NEURO: pleasant and cooperative, no obvious depression or anxiety, speech and thought processing grossly intact  ASSESSMENT AND PLAN:  Discussed the following assessment and plan:  Skin lesion  -we discussed possible serious and likely etiologies, options for evaluation and workup, limitations of telemedicine visit vs in person visit, treatment, treatment risks and precautions. Query traumatized resistant tinea vs other. Discussed options and she has opted for in person visit with PCP next week for improved visualization, LFTs if suspected tinea so can start oral antifungal vs referral to derm or biopsy if diagnosis unclear with closer inspection. In the interim she is going to try bid lotrimin without missing doses and topical hct cream. Message sent to schedulers to assist and pt agrees to call office if she does not receive a call about that appt in the next 24-48 hours.    I discussed the assessment and treatment plan with the patient. The patient was provided an opportunity to ask questions and all were answered. The patient agreed with the plan and demonstrated an understanding of the instructions.   The patient was advised to call back or seek an in-person evaluation if the symptoms worsen or if the condition fails to improve as anticipated.   Terressa Koyanagi, DO

## 2020-02-08 ENCOUNTER — Other Ambulatory Visit: Payer: Self-pay | Admitting: Family Medicine

## 2020-03-03 ENCOUNTER — Encounter: Payer: Self-pay | Admitting: Family Medicine

## 2020-03-03 ENCOUNTER — Telehealth (INDEPENDENT_AMBULATORY_CARE_PROVIDER_SITE_OTHER): Payer: Managed Care, Other (non HMO) | Admitting: Family Medicine

## 2020-03-03 DIAGNOSIS — B9689 Other specified bacterial agents as the cause of diseases classified elsewhere: Secondary | ICD-10-CM | POA: Diagnosis not present

## 2020-03-03 DIAGNOSIS — J329 Chronic sinusitis, unspecified: Secondary | ICD-10-CM | POA: Diagnosis not present

## 2020-03-03 MED ORDER — DOXYCYCLINE HYCLATE 100 MG PO TABS: 100.0000 mg | ORAL_TABLET | Freq: Two times a day (BID) | ORAL | 0 refills | Status: DC

## 2020-03-03 NOTE — Progress Notes (Signed)
   Virtual Visit via Video   I connected with patient on 03/03/20 at  1:00 PM EDT by a video enabled telemedicine application and verified that I am speaking with the correct person using two identifiers.  Location patient: Home Location provider: [place], Office Persons participating in the virtual visit: Patient, Provider, CMA (Jess B)  I discussed the limitations of evaluation and management by telemedicine and the availability of in person appointments. The patient expressed understanding and agreed to proceed.  Subjective:   HPI:   URI- 'the worst issues w/ my sinuses'.  Woke yesterday w/ a sore throat and then developed nasal congestion.  Today woke w/ pain behind her eyes, in her cheeks, tooth pain.  + sneezing.  Took Sudafed last night.  Tylenol and cold and flu yesterday w/o relief.  No fevers.  No changes to taste or smell.  No body aches.  + fatigue.  No known COVID exposure.  ROS:   See pertinent positives and negatives per HPI.  Patient Active Problem List   Diagnosis Date Noted  . Physical exam 03/25/2019  . Obesity (BMI 30-39.9) 03/25/2019  . Birth control counseling 04/08/2017  . Anxiety and depression 09/03/2016    Social History   Tobacco Use  . Smoking status: Never Smoker  . Smokeless tobacco: Never Used  Substance Use Topics  . Alcohol use: No    Current Outpatient Medications:  .  escitalopram (LEXAPRO) 20 MG tablet, Take 20 mg by mouth daily., Disp: , Rfl:  .  JUNEL 1/20 1-20 MG-MCG tablet, TAKE 1 TABLET BY MOUTH DAILY, Disp: 63 tablet, Rfl: 0  Allergies  Allergen Reactions  . Methylprednisolone Hives  . Latex Hives and Rash  . Penicillin G Rash    Did it involve swelling of the face/tongue/throat, SOB, or low BP? Yes Did it involve sudden or severe rash/hives, skin peeling, or any reaction on the inside of your mouth or nose? Unk Did you need to seek medical attention at a hospital or doctor's office? Unk When did it last  happen?"happened when I was a baby" If all above answers are "NO", may proceed with cephalosporin use.   Marland Kitchen Shellfish-Derived Products Hives and Rash    Objective:   LMP 02/11/2020  AAOx3, NAD NCAT, EOMI No obvious CN deficits Audible nasal congestion + frontal and maxillary sinus pain Coloring WNL Pt is able to speak clearly, coherently without shortness of breath or increased work of breathing.  Thought process is linear.  Mood is appropriate.   Assessment and Plan:   Bacterial sinusitis- pt had symptoms about a week ago that she thought were a cold or allergies.  Then yesterday things worsened and she now has severe sinus pain/pressure w/ tooth pain, ear pain, HA.  Start Doxy since she is allergic to PCN.  Reviewed risk of sun sensitivity.  Reviewed supportive care and red flags that should prompt return.  Pt expressed understanding and is in agreement w/ plan.    Neena Rhymes, MD 03/03/2020

## 2020-03-17 ENCOUNTER — Encounter: Payer: Self-pay | Admitting: Family Medicine

## 2020-03-17 MED ORDER — AZITHROMYCIN 250 MG PO TABS: ORAL_TABLET | ORAL | 0 refills | Status: DC

## 2020-03-23 ENCOUNTER — Other Ambulatory Visit: Payer: Self-pay

## 2020-03-23 ENCOUNTER — Ambulatory Visit: Payer: Managed Care, Other (non HMO) | Admitting: Family Medicine

## 2020-03-23 ENCOUNTER — Encounter: Payer: Self-pay | Admitting: Family Medicine

## 2020-03-23 LAB — LIPID PANEL
Cholesterol: 100 mg/dL (ref 0–200)
HDL: 30.5 mg/dL — ABNORMAL LOW (ref >39.00)
LDL Cholesterol: 51 mg/dL (ref 0–99)
NonHDL: 69.94
Total CHOL/HDL Ratio: 3
Triglycerides: 94 mg/dL (ref 0.0–149.0)
VLDL: 18.8 mg/dL (ref 0.0–40.0)

## 2020-03-23 LAB — HEPATIC FUNCTION PANEL
ALT: 18 U/L (ref 0–35)
AST: 21 U/L (ref 0–37)
Albumin: 4.1 g/dL (ref 3.5–5.2)
Alkaline Phosphatase: 67 U/L (ref 39–117)
Bilirubin, Direct: 0.1 mg/dL (ref 0.0–0.3)
Total Bilirubin: 0.4 mg/dL (ref 0.2–1.2)
Total Protein: 6.7 g/dL (ref 6.0–8.3)

## 2020-03-23 LAB — BASIC METABOLIC PANEL
BUN: 13 mg/dL (ref 6–23)
CO2: 29 mEq/L (ref 19–32)
Calcium: 9.2 mg/dL (ref 8.4–10.5)
Chloride: 104 mEq/L (ref 96–112)
Creatinine, Ser: 0.74 mg/dL (ref 0.40–1.20)
GFR: 98.56 mL/min (ref >60.00)
Glucose, Bld: 96 mg/dL (ref 70–99)
Potassium: 4.2 mEq/L (ref 3.5–5.1)
Sodium: 137 mEq/L (ref 135–145)

## 2020-03-23 LAB — CBC WITH DIFFERENTIAL/PLATELET
Basophils Absolute: 0.1 10*3/uL (ref 0.0–0.1)
Basophils Relative: 1.2 % (ref 0.0–3.0)
Eosinophils Absolute: 0.2 10*3/uL (ref 0.0–0.7)
Eosinophils Relative: 4.1 % (ref 0.0–5.0)
HCT: 40.1 % (ref 36.0–46.0)
Hemoglobin: 13.7 g/dL (ref 12.0–15.0)
Lymphocytes Relative: 34.6 % (ref 12.0–46.0)
Lymphs Abs: 1.8 10*3/uL (ref 0.7–4.0)
MCHC: 34.1 g/dL (ref 30.0–36.0)
MCV: 90.4 fl (ref 78.0–100.0)
Monocytes Absolute: 0.5 10*3/uL (ref 0.1–1.0)
Monocytes Relative: 8.7 % (ref 3.0–12.0)
Neutro Abs: 2.7 10*3/uL (ref 1.4–7.7)
Neutrophils Relative %: 51.4 % (ref 43.0–77.0)
Platelets: 216 10*3/uL (ref 150.0–400.0)
RBC: 4.43 Mil/uL (ref 3.87–5.11)
RDW: 13.1 % (ref 11.5–15.5)
WBC: 5.2 10*3/uL (ref 4.0–10.5)

## 2020-03-23 LAB — TSH: TSH: 1.76 u[IU]/mL (ref 0.35–4.50)

## 2020-03-23 NOTE — Assessment & Plan Note (Addendum)
Deteriorated.  Pt has gained 30 lbs in last year.  She reports she is calorie counting and is aware of serving sizes.  She is not doing any exercise or physical activity b/c she fears this will make her more tired and she is already 'exhausted'.  Discussed that exercise is something you have to pay forward and that it will actually give you more energy once it is a regular habit.  Encouraged her to start small with walking and increase from there.  Also encouraged Weight [employer] to help add a bit of structure to her calorie counting.  Will check labs to risk stratify and follow closely.  Pt expressed understanding and is in agreement w/ plan.

## 2020-03-23 NOTE — Progress Notes (Signed)
   Subjective:    Patient ID: Stefanie Farrell, female    DOB: Mar 24, 1996, 24 y.o.   MRN: [id number]  HPI Obesity- pt has gained 30 lbs in last year.  'I can't seem to lose weight'.  Pt is calorie counting- staying at or below 2000 calories.  Pt reports she is measuring based on appropriate serving size.  On her feet all day.  By the time she gets home she has no energy to work out.  'i'm tired all the time'.  Pt reports very poor sleep.  Difficulty both falling asleep and staying asleep.  No reports of breathing pauses or loud snoring.  Psych has her on Trazodone for sleep but this causes excessive grogginess in the AM so she doesn't often use it.  Psych wanted thyroid checked.  No known family hx of thyroid issues.  Typically starts day at 9am.  Works 11-7 M-F.     Review of Systems For ROS see HPI   This visit occurred during the SARS-CoV-2 public health emergency.  Safety protocols were in place, including screening questions prior to the visit, additional usage of staff PPE, and extensive cleaning of exam room while observing appropriate contact time as indicated for disinfecting solutions.       Objective:   Physical Exam Vitals reviewed.  Constitutional:      General: She is not in acute distress.    Appearance: She is well-developed. She is obese.  HENT:     Head: Normocephalic and atraumatic.  Eyes:     Conjunctiva/sclera: Conjunctivae normal.     Pupils: Pupils are equal, round, and reactive to light.  Neck:     Thyroid: No thyromegaly.  Cardiovascular:     Rate and Rhythm: Normal rate and regular rhythm.     Heart sounds: Normal heart sounds. No murmur heard.   Pulmonary:     Effort: Pulmonary effort is normal. No respiratory distress.     Breath sounds: Normal breath sounds.  Abdominal:     General: There is no distension.     Palpations: Abdomen is soft.     Tenderness: There is no abdominal tenderness.  Musculoskeletal:     Cervical back: Normal range of motion and  neck supple.  Lymphadenopathy:     Cervical: No cervical adenopathy.  Skin:    General: Skin is warm and dry.  Neurological:     Mental Status: She is alert and oriented to person, place, and time.  Psychiatric:        Behavior: Behavior normal.           Assessment & Plan:

## 2020-03-23 NOTE — Patient Instructions (Signed)
Follow up in 3 months to recheck weight loss progress We'll notify you of your lab results and make any changes if needed Consider starting [employer] to help with calorie counting/diet Add daily activity.  Start small- walking for 20-30 minutes and then increase from there Call with any questions or concerns Hang in there!!

## 2020-03-24 LAB — HEMOGLOBIN A1C: Hgb A1c MFr Bld: 5.3 % (ref 4.6–6.5)

## 2020-04-27 ENCOUNTER — Other Ambulatory Visit: Payer: Self-pay | Admitting: Family Medicine

## 2020-05-25 ENCOUNTER — Other Ambulatory Visit: Payer: Self-pay | Admitting: General Practice

## 2020-05-25 MED ORDER — NORETHINDRONE ACET-ETHINYL EST 1-20 MG-MCG PO TABS: 1.0000 | ORAL_TABLET | Freq: Every day | ORAL | 0 refills | Status: DC

## 2020-06-26 ENCOUNTER — Ambulatory Visit: Payer: Managed Care, Other (non HMO) | Admitting: Family Medicine

## 2020-07-17 ENCOUNTER — Ambulatory Visit: Payer: Managed Care, Other (non HMO) | Admitting: Family Medicine

## 2020-07-18 ENCOUNTER — Ambulatory Visit: Payer: Managed Care, Other (non HMO) | Admitting: Family Medicine

## 2020-07-24 ENCOUNTER — Other Ambulatory Visit: Payer: Self-pay | Admitting: General Practice

## 2020-07-24 MED ORDER — NORETHINDRONE ACET-ETHINYL EST 1-20 MG-MCG PO TABS: 1.0000 | ORAL_TABLET | Freq: Every day | ORAL | 1 refills | Status: DC

## 2020-07-27 ENCOUNTER — Ambulatory Visit (INDEPENDENT_AMBULATORY_CARE_PROVIDER_SITE_OTHER): Payer: Managed Care, Other (non HMO) | Admitting: Family Medicine

## 2020-07-27 ENCOUNTER — Encounter: Payer: Self-pay | Admitting: Family Medicine

## 2020-07-27 ENCOUNTER — Other Ambulatory Visit: Payer: Self-pay

## 2020-07-27 ENCOUNTER — Encounter: Payer: Self-pay | Admitting: General Practice

## 2020-07-27 DIAGNOSIS — R5383 Other fatigue: Secondary | ICD-10-CM

## 2020-07-27 DIAGNOSIS — L700 Acne vulgaris: Secondary | ICD-10-CM | POA: Diagnosis not present

## 2020-07-27 DIAGNOSIS — B078 Other viral warts: Secondary | ICD-10-CM

## 2020-07-27 DIAGNOSIS — R519 Headache, unspecified: Secondary | ICD-10-CM

## 2020-07-27 MED ORDER — IMIQUIMOD 5 % EX CREA: TOPICAL_CREAM | CUTANEOUS | 0 refills | Status: DC

## 2020-07-27 MED ORDER — BELSOMRA 10 MG PO TABS: 1.0000 | ORAL_TABLET | Freq: Every day | ORAL | 1 refills | Status: DC

## 2020-07-27 NOTE — Addendum Note (Signed)
Addended by: Lana Fish on: 07/27/2020 02:08 PM   Modules accepted: Orders

## 2020-07-27 NOTE — Progress Notes (Signed)
Subjective:    Patient ID: Stefanie Farrell, female    DOB: 1995-12-22, 24 y.o.   MRN: [id number]  HPI Morbid obesity-  'i'm miserable'.  Pt has gained 11 lbs since last visit.  She was doing [employer] but is now calorie counting and keeping things <2000 calories.  Is walking for 'at least 30 minutes 4-5x/week'.  HAs- L sided above eye, occurring over the last several months.  Occurring at work.  Rarely occurs on weekends.  Pt thinks it could be stress related.  Pain is sharp.  No photo or phonophobia.  No nausea.  Acne- pt has never had issues w/ skin before and now face is very oily and she is breaking out.  Having regular periods.  Using Neutrogena face wash and Differin gel OTC  Fatigue- 'tired all the time'.  Pt has 'a really hard time falling asleep'.  No loud snoring or breathing pauses.  Melatonin is ineffective and 25mg  of Trazodone makes her feel 'drunk'.    L palmar wart- asking for imiquimod.   Review of Systems For ROS see HPI   This visit occurred during the SARS-CoV-2 public health emergency.  Safety protocols were in place, including screening questions prior to the visit, additional usage of staff PPE, and extensive cleaning of exam room while observing appropriate contact time as indicated for disinfecting solutions.       Objective:   Physical Exam Vitals reviewed.  Constitutional:      General: She is not in acute distress.    Appearance: She is obese. She is not ill-appearing or toxic-appearing.  HENT:     Head: Normocephalic and atraumatic.  Eyes:     Extraocular Movements: Extraocular movements intact.     Conjunctiva/sclera: Conjunctivae normal.     Pupils: Pupils are equal, round, and reactive to light.  Musculoskeletal:     Right lower leg: No edema.     Left lower leg: No edema.  Skin:    General: Skin is warm and dry.     Coloration: Skin is not pale.     Findings: Lesion (<1 cm L palmar wart) present. No erythema.  Neurological:     General: No focal  deficit present.     Mental Status: She is alert and oriented to person, place, and time.  Psychiatric:        Mood and Affect: Mood normal.        Behavior: Behavior normal.        Thought Content: Thought content normal.           Assessment & Plan:  HAs- suspect this is due to her ongoing fatigue, stress, anxiety, and excess weight.  Her sxs are not classic migraine sxs.  The fact that they do not occur on the weekends is reassuring that it's not a structural cause.  Discussed that this is likely a byproduct of lifestyle at this time and encouraged her to drink plenty of water, work on stress management, and continue healthy diet and regular exercise.  She can continue OTC pain relievers as needed.  Acne- new.  Pt has never had issues w/ breakouts or oily skin and suddenly she is struggling.  She is using Neutrogena face wash and differin gel.  Will check hormones to assess for PCOS or other underlying cause.  Pt expressed understanding and is in agreement w/ plan.   Fatigue- deteriorated.  Likely multifactorial.  Pt has hard time falling asleep but has no relief w/ melatonin and  Trazodone causes her to feel 'drunk'.  Will start Belsomra to improve sleep and hopefully this will improve fatigue.  Will also check labs to r/o underlying causes.  Will follow.  Palmar wart- new.  Pt reports she has hx of this and they only tend to improve w/ Imiquimod.  Prescription sent.

## 2020-07-27 NOTE — Patient Instructions (Addendum)
We'll notify you of your lab results and determine the next steps We'll call you with your Medical Weight Management appt Continue to use Neutrogena and Differin for the acne Use the Imiquimod as directed on the wart Make sure you are drinking plenty of water to try and reduce headaches It is safe to take Excedrin Migraine as needed Try the Belsomra to improve sleep- go to their website to look for a coupon card Call with any questions or concerns Hang in there!!!

## 2020-07-27 NOTE — Assessment & Plan Note (Signed)
Deteriorated.  Pt has gained another 11 lbs despite actively trying to lose weight.  She has been doing [employer] and counting calories and attempting to get 30 minutes of cardio 4-5x/week.  She is concerned this is hormonal.  Will check labs to assess.  Will also refer to Medical Weight Management.  Pt expressed understanding and is in agreement w/ plan.

## 2020-08-07 ENCOUNTER — Encounter: Payer: Self-pay | Admitting: Family Medicine

## 2020-08-07 MED ORDER — BUSPIRONE HCL 7.5 MG PO TABS: 7.5000 mg | ORAL_TABLET | Freq: Two times a day (BID) | ORAL | 3 refills | Status: DC

## 2020-09-25 ENCOUNTER — Encounter: Payer: Self-pay | Admitting: Family Medicine

## 2020-09-25 MED ORDER — DICYCLOMINE HCL 20 MG PO TABS: 20.0000 mg | ORAL_TABLET | Freq: Three times a day (TID) | ORAL | 0 refills | Status: DC | PRN

## 2020-10-02 ENCOUNTER — Other Ambulatory Visit: Payer: Self-pay

## 2020-10-02 NOTE — Telephone Encounter (Signed)
LFD 09/25/20 #30 with no refills LOV 07/27/20 NOV none

## 2020-10-03 MED ORDER — DICYCLOMINE HCL 20 MG PO TABS: 20.0000 mg | ORAL_TABLET | Freq: Three times a day (TID) | ORAL | 0 refills | Status: DC | PRN

## 2020-10-19 ENCOUNTER — Other Ambulatory Visit: Payer: Self-pay

## 2020-10-19 MED ORDER — DICYCLOMINE HCL 20 MG PO TABS: 20.0000 mg | ORAL_TABLET | Freq: Three times a day (TID) | ORAL | 0 refills | Status: DC | PRN

## 2020-10-19 NOTE — Telephone Encounter (Signed)
LFD 10/03/20 #30 with no refills LOV 07/27/20 NOV 11/03/20

## 2020-10-31 ENCOUNTER — Other Ambulatory Visit: Payer: Self-pay | Admitting: Family Medicine

## 2020-11-01 ENCOUNTER — Encounter: Payer: Self-pay | Admitting: Family Medicine

## 2020-11-01 ENCOUNTER — Telehealth (INDEPENDENT_AMBULATORY_CARE_PROVIDER_SITE_OTHER): Payer: Managed Care, Other (non HMO) | Admitting: Family Medicine

## 2020-11-01 DIAGNOSIS — F419 Anxiety disorder, unspecified: Secondary | ICD-10-CM | POA: Diagnosis not present

## 2020-11-01 DIAGNOSIS — F32A Depression, unspecified: Secondary | ICD-10-CM | POA: Diagnosis not present

## 2020-11-01 MED ORDER — BUPROPION HCL 75 MG PO TABS: 75.0000 mg | ORAL_TABLET | Freq: Two times a day (BID) | ORAL | 3 refills | Status: DC

## 2020-11-01 NOTE — Progress Notes (Signed)
Virtual Visit via Video   I connected with patient on 11/01/20 at  8:30 AM EST by a video enabled telemedicine application and verified that I am speaking with the correct person using two identifiers.  Location patient: Home Location provider: [facility], [facility] Persons participating in the virtual visit: Patient, Provider, CMA (Sabrina M)  I discussed the limitations of evaluation and management by telemedicine and the availability of in person appointments. The patient expressed understanding and agreed to proceed.  Subjective:   HPI:   Anxiety- chronic problem, on Lexapro 20mg  daily and Buspar 7.5mg  BID.  'i'm not doing so good'.  Reports anxiety 'has been really bad lately'.  'I get so mad'.  Pt finds that hyperactivity triggers her anxiety, feeling overwhelmed at work, large groups.  sxs have worsened over the last ~2 months.  She was avoiding family gatherings over the holidays due to anxiety.  Not currently on Buspar 7.5mg  BID b/c 'she couldn't tell a difference'.  Pt feels depression is well controlled.  When pt is not working she has very low motivation, 'I don't want to do anything'.  ROS:   See pertinent positives and negatives per HPI.  Patient Active Problem List   Diagnosis Date Noted  . Morbid obesity (HCC) 03/23/2020  . Physical exam 03/25/2019  . Birth control counseling 04/08/2017  . Anxiety and depression 09/03/2016    Social History   Tobacco Use  . Smoking status: Never Smoker  . Smokeless tobacco: Never Used  Substance Use Topics  . Alcohol use: No    Current Outpatient Medications:  .  dicyclomine (BENTYL) 20 MG tablet, TAKE ONE TABLET BY MOUTH THREE TIMES A DAY AS NEEDED FOR SPASMS, Disp: 30 tablet, Rfl: 0 .  escitalopram (LEXAPRO) 20 MG tablet, Take 20 mg by mouth daily., Disp: , Rfl:  .  norethindrone-ethinyl estradiol (JUNEL 1/20) 1-20 MG-MCG tablet, Take 1 tablet by mouth daily., Disp: 63 tablet, Rfl: 1 .  busPIRone (BUSPAR) 7.5 MG  tablet, Take 1 tablet (7.5 mg total) by mouth 2 (two) times daily. (Patient not taking: Reported on 11/01/2020), Disp: 60 tablet, Rfl: 3 .  imiquimod (ALDARA) 5 % cream, Apply topically 3 (three) times a week. (Patient not taking: Reported on 11/01/2020), Disp: 12 each, Rfl: 0 .  Suvorexant (BELSOMRA) 10 MG TABS, Take 1 tablet by mouth at bedtime. (Patient not taking: Reported on 11/01/2020), Disp: 30 tablet, Rfl: 1 .  traZODone (DESYREL) 50 MG tablet, Take 50 mg by mouth at bedtime as needed for sleep. (Patient not taking: Reported on 11/01/2020), Disp: , Rfl:   Allergies  Allergen Reactions  . Methylprednisolone Hives  . Latex Hives and Rash  . Penicillin G Rash    Did it involve swelling of the face/tongue/throat, SOB, or low BP? Yes Did it involve sudden or severe rash/hives, skin peeling, or any reaction on the inside of your mouth or nose? Unk Did you need to seek medical attention at a hospital or doctor's office? Unk When did it last happen?"happened when I was a baby" If all above answers are "NO", may proceed with cephalosporin use.   . Shellfish-Derived Products Hives and Rash    Objective:   There were no vitals taken for this visit. AAOx3, NAD NCAT, EOMI No obvious CN deficits Coloring WNL Pt is able to speak clearly, coherently without shortness of breath or increased work of breathing.  Thought process is linear.  Mood is appropriate.   Assessment and Plan:   Depression/anxiety-  anxiety has deteriorated while depression is thankfully stable.  Increased anger and irritability.  She is now avoiding family and social functions due to the anxiety it causes.  Will restart Wellbutrin 75mg  BID to better control anxiety.  If this doesn't work, we will revisit the Buspar at a higher dose.  Pt expressed understanding and is in agreement w/ plan.   , MD 11/01/2020

## 2020-11-01 NOTE — Progress Notes (Signed)
I connected with  Stefanie Farrell on 11/01/20 by a video enabled telemedicine application and verified that I am speaking with the correct person using two identifiers.   I discussed the limitations of evaluation and management by telemedicine. The patient expressed understanding and agreed to proceed.

## 2020-11-03 ENCOUNTER — Ambulatory Visit: Payer: Managed Care, Other (non HMO) | Admitting: Family Medicine

## 2020-11-17 ENCOUNTER — Ambulatory Visit: Payer: Managed Care, Other (non HMO) | Admitting: Family Medicine

## 2020-11-27 ENCOUNTER — Other Ambulatory Visit: Payer: Self-pay

## 2020-11-27 DIAGNOSIS — Z3009 Encounter for other general counseling and advice on contraception: Secondary | ICD-10-CM

## 2020-11-27 MED ORDER — NORETHINDRONE ACET-ETHINYL EST 1-20 MG-MCG PO TABS: 1.0000 | ORAL_TABLET | Freq: Every day | ORAL | 0 refills | Status: DC

## 2020-12-12 ENCOUNTER — Telehealth (INDEPENDENT_AMBULATORY_CARE_PROVIDER_SITE_OTHER): Payer: Managed Care, Other (non HMO) | Admitting: Family Medicine

## 2020-12-12 ENCOUNTER — Encounter: Payer: Self-pay | Admitting: Family Medicine

## 2020-12-12 VITALS — Wt 281.8 lb

## 2020-12-12 DIAGNOSIS — F419 Anxiety disorder, unspecified: Secondary | ICD-10-CM | POA: Diagnosis not present

## 2020-12-12 DIAGNOSIS — F32A Depression, unspecified: Secondary | ICD-10-CM | POA: Diagnosis not present

## 2020-12-12 MED ORDER — BUPROPION HCL ER (XL) 150 MG PO TB24: 150.0000 mg | ORAL_TABLET | Freq: Every day | ORAL | 3 refills | Status: DC

## 2020-12-12 NOTE — Progress Notes (Signed)
I connected with  Stefanie Farrell on 12/12/20 by a video enabled telemedicine application and verified that I am speaking with the correct person using two identifiers.   I discussed the limitations of evaluation and management by telemedicine. The patient expressed understanding and agreed to proceed.

## 2020-12-12 NOTE — Progress Notes (Signed)
Virtual Visit via Video   I connected with patient on 12/12/20 at  1:30 PM EST by a video enabled telemedicine application and verified that I am speaking with the correct person using two identifiers.  Location patient: Home Location provider: [facility], [facility] Persons participating in the virtual visit: Patient, Provider, CMA (Sabrina M)  I discussed the limitations of evaluation and management by telemedicine and the availability of in person appointments. The patient expressed understanding and agreed to proceed.  Subjective:   HPI:   Anxiety/Depression- ongoing issue.  On Lexapro 20mg  daily.  Wellbutrin 75mg  BID was added last visit.  Pt is having a difficult time taking medicine twice daily- often forgets AM dose.  Pt is interested in increasing to the XR 150mg .  No noticed side effects.  Pt says the biggest difference is the improved energy/motivation.  Has started with the bariatric clinic and is now on Phentermine.  No increase in anxiety  ROS:   See pertinent positives and negatives per HPI.  Patient Active Problem List   Diagnosis Date Noted  . Morbid obesity (HCC) 03/23/2020  . Physical exam 03/25/2019  . Birth control counseling 04/08/2017  . Anxiety and depression 09/03/2016    Social History   Tobacco Use  . Smoking status: Never Smoker  . Smokeless tobacco: Never Used  Substance Use Topics  . Alcohol use: No    Current Outpatient Medications:  .  buPROPion (WELLBUTRIN) 75 MG tablet, Take 1 tablet (75 mg total) by mouth 2 (two) times daily., Disp: 60 tablet, Rfl: 3 .  dicyclomine (BENTYL) 20 MG tablet, TAKE ONE TABLET BY MOUTH THREE TIMES A DAY AS NEEDED FOR SPASMS, Disp: 30 tablet, Rfl: 0 .  escitalopram (LEXAPRO) 20 MG tablet, Take 20 mg by mouth daily., Disp: , Rfl:  .  norethindrone-ethinyl estradiol (JUNEL 1/20) 1-20 MG-MCG tablet, Take 1 tablet by mouth daily., Disp: 63 tablet, Rfl: 0 .  phentermine 37.5 MG capsule, Take 37.5 mg by mouth  every morning., Disp: , Rfl:  .  busPIRone (BUSPAR) 7.5 MG tablet, Take 1 tablet (7.5 mg total) by mouth 2 (two) times daily. (Patient not taking: No sig reported), Disp: 60 tablet, Rfl: 3 .  imiquimod (ALDARA) 5 % cream, Apply topically 3 (three) times a week. (Patient not taking: No sig reported), Disp: 12 each, Rfl: 0 .  Suvorexant (BELSOMRA) 10 MG TABS, Take 1 tablet by mouth at bedtime. (Patient not taking: No sig reported), Disp: 30 tablet, Rfl: 1 .  traZODone (DESYREL) 50 MG tablet, Take 50 mg by mouth at bedtime as needed for sleep. (Patient not taking: No sig reported), Disp: , Rfl:   Allergies  Allergen Reactions  . Methylprednisolone Hives  . Latex Hives and Rash  . Penicillin G Rash    Did it involve swelling of the face/tongue/throat, SOB, or low BP? Yes Did it involve sudden or severe rash/hives, skin peeling, or any reaction on the inside of your mouth or nose? Unk Did you need to seek medical attention at a hospital or doctor's office? Unk When did it last happen?"happened when I was a baby" If all above answers are "NO", may proceed with cephalosporin use.   . Shellfish-Derived Products Hives and Rash    Objective:   Wt 281 lb 12.8 oz (127.8 kg)   BMI 41.61 kg/m  AAOx3, NAD NCAT, EOMI No obvious CN deficits Coloring WNL Pt is able to speak clearly, coherently without shortness of breath or increased work of breathing.  Thought process is linear.  Mood is appropriate.   Assessment and Plan:   Anxiety/depression- improved since starting Wellbutrin.  She is having a hard time taking her medication twice daily so we will increase the Wellbutrin to 150mg  XR.  Pt reports increased motivation and energy level and is very pleased w/ that.  Will continue to follow closely.  Pt expressed understanding and is in agreement w/ plan.    , MD 12/12/2020

## 2021-02-06 ENCOUNTER — Other Ambulatory Visit: Payer: Self-pay | Admitting: Family Medicine

## 2021-02-06 DIAGNOSIS — Z3009 Encounter for other general counseling and advice on contraception: Secondary | ICD-10-CM

## 2021-02-12 ENCOUNTER — Encounter: Payer: Self-pay | Admitting: Family Medicine

## 2021-02-19 ENCOUNTER — Encounter: Payer: Self-pay | Admitting: Family Medicine

## 2021-02-19 ENCOUNTER — Telehealth (INDEPENDENT_AMBULATORY_CARE_PROVIDER_SITE_OTHER): Payer: Managed Care, Other (non HMO) | Admitting: Family Medicine

## 2021-02-19 VITALS — Wt 266.0 lb

## 2021-02-19 DIAGNOSIS — F32A Depression, unspecified: Secondary | ICD-10-CM | POA: Diagnosis not present

## 2021-02-19 DIAGNOSIS — F419 Anxiety disorder, unspecified: Secondary | ICD-10-CM

## 2021-02-19 MED ORDER — ALPRAZOLAM 0.5 MG PO TABS: 0.5000 mg | ORAL_TABLET | Freq: Two times a day (BID) | ORAL | 1 refills | Status: DC | PRN

## 2021-02-19 NOTE — Progress Notes (Signed)
I connected with  [name] on 02/19/21 by a video enabled telemedicine application and verified that I am speaking with the correct person using two identifiers.   I discussed the limitations of evaluation and management by telemedicine. The patient expressed understanding and agreed to proceed.

## 2021-02-19 NOTE — Progress Notes (Signed)
Virtual Visit via Video   I connected with patient on 02/19/21 at  1:00 PM EDT by a video enabled telemedicine application and verified that I am speaking with the correct person using two identifiers.  Location patient: Home Location provider: [facility], [facility] Persons participating in the virtual visit: Patient, Provider, CMA (Sabrina M)  I discussed the limitations of evaluation and management by telemedicine and the availability of in person appointments. The patient expressed understanding and agreed to proceed.  Subjective:   HPI:   Anxiety/Depression- At last visit we increased her Wellbutrin to 150mg  daily and she continues her Lexapro 20mg  daily.  She reports Lexapro is the only thing that seems to have worked.  Anxiety remains high- particularly if things are busy or loud.  Pt is able to get through her work day but says that all she wants to do on the weekends is sleep.  Pt reports the crowd/noise anxiety occurs 2-3x/week.  ROS:   See pertinent positives and negatives per HPI.  Patient Active Problem List   Diagnosis Date Noted  . Morbid obesity (HCC) 03/23/2020  . Physical exam 03/25/2019  . Birth control counseling 04/08/2017  . Anxiety and depression 09/03/2016    Social History   Tobacco Use  . Smoking status: Never Smoker  . Smokeless tobacco: Never Used  Substance Use Topics  . Alcohol use: No    Current Outpatient Medications:  .  buPROPion (WELLBUTRIN XL) 150 MG 24 hr tablet, Take 1 tablet (150 mg total) by mouth daily., Disp: 30 tablet, Rfl: 3 .  dicyclomine (BENTYL) 20 MG tablet, TAKE ONE TABLET BY MOUTH THREE TIMES A DAY AS NEEDED FOR SPASMS, Disp: 30 tablet, Rfl: 0 .  escitalopram (LEXAPRO) 20 MG tablet, Take 20 mg by mouth daily., Disp: , Rfl:  .  JUNEL 1/20 1-20 MG-MCG tablet, TAKE ONE TABLET BY MOUTH DAILY, Disp: 63 tablet, Rfl: 0 .  phentermine 37.5 MG capsule, Take 37.5 mg by mouth every morning., Disp: , Rfl:   Allergies   Allergen Reactions  . Methylprednisolone Hives  . Latex Hives and Rash  . Penicillin G Rash    Did it involve swelling of the face/tongue/throat, SOB, or low BP? Yes Did it involve sudden or severe rash/hives, skin peeling, or any reaction on the inside of your mouth or nose? Unk Did you need to seek medical attention at a hospital or doctor's office? Unk When did it last happen?"happened when I was a baby" If all above answers are "NO", may proceed with cephalosporin use.   . Shellfish-Derived Products Hives and Rash    Objective:   Wt 266 lb (120.7 kg)   BMI 39.28 kg/m   AAOx3, NAD NCAT, EOMI No obvious CN deficits Coloring WNL Pt is able to speak clearly, coherently without shortness of breath or increased work of breathing.  Thought process is linear.  Mood is appropriate.   Assessment and Plan:   Anxiety/Depression- ongoing issue for pt.  At last visit was doing well with the Wellbutrin but feels this may not be as effective at this point.  Her triggers seem to be noisy or busy environments which is causing her to isolate herself.  This isolation is likely worsening her depression and the cycle continues.  Will start low dose Alprazolam to use in anxiety provoking busy situations which will hopefully allow her to participate in life the way she wants to.  Will continue to follow closely.  04/10/2017, MD 02/19/2021

## 2021-02-22 ENCOUNTER — Other Ambulatory Visit: Payer: Self-pay

## 2021-02-22 DIAGNOSIS — M62838 Other muscle spasm: Secondary | ICD-10-CM

## 2021-02-22 MED ORDER — DICYCLOMINE HCL 20 MG PO TABS: ORAL_TABLET | ORAL | 0 refills | Status: DC

## 2021-03-05 ENCOUNTER — Telehealth: Payer: Self-pay | Admitting: Family Medicine

## 2021-03-05 NOTE — Telephone Encounter (Signed)
LM asking pt to call back to schedule a f/up visit with KT from 02/19/21, it needs to be in 3-4wk from that date.

## 2021-04-11 ENCOUNTER — Encounter: Payer: Self-pay | Admitting: *Deleted

## 2021-04-13 ENCOUNTER — Other Ambulatory Visit: Payer: Self-pay

## 2021-04-13 ENCOUNTER — Other Ambulatory Visit: Payer: Self-pay | Admitting: Family Medicine

## 2021-04-13 DIAGNOSIS — Z3009 Encounter for other general counseling and advice on contraception: Secondary | ICD-10-CM

## 2021-04-13 MED ORDER — NORETHINDRONE ACET-ETHINYL EST 1-20 MG-MCG PO TABS: 1.0000 | ORAL_TABLET | Freq: Every day | ORAL | 0 refills | Status: DC

## 2021-05-29 ENCOUNTER — Encounter: Payer: Self-pay | Admitting: Family Medicine

## 2021-05-30 ENCOUNTER — Other Ambulatory Visit: Payer: Self-pay | Admitting: Family

## 2021-05-30 MED ORDER — EPINEPHRINE 0.3 MG/0.3ML IJ SOAJ: 0.3000 mg | INTRAMUSCULAR | 0 refills | Status: AC | PRN

## 2021-06-15 ENCOUNTER — Other Ambulatory Visit: Payer: Self-pay

## 2021-06-15 DIAGNOSIS — Z3009 Encounter for other general counseling and advice on contraception: Secondary | ICD-10-CM

## 2021-06-15 MED ORDER — NORETHINDRONE ACET-ETHINYL EST 1-20 MG-MCG PO TABS: 1.0000 | ORAL_TABLET | Freq: Every day | ORAL | 0 refills | Status: DC

## 2021-06-16 IMAGING — CT CT ABDOMEN AND PELVIS WITHOUT CONTRAST
2 of 4 series · 17 of 46 positions shown, 19 images · non-contrast
Comparison: None.

CLINICAL DATA: Acute abdominal pain and distension. Patient reports
left flank pain.

EXAM:
CT ABDOMEN AND PELVIS WITHOUT CONTRAST
TECHNIQUE: Multidetector CT imaging of the abdomen and pelvis was performed
following the standard protocol without IV contrast.

[Series 3: a/p w/o 5mm · axial · non-contrast · 0.98mm/px · z∈[-576,-66]mm · 14 of 112 slices shown, 16 images]
[im 5/112  soft-tissue]
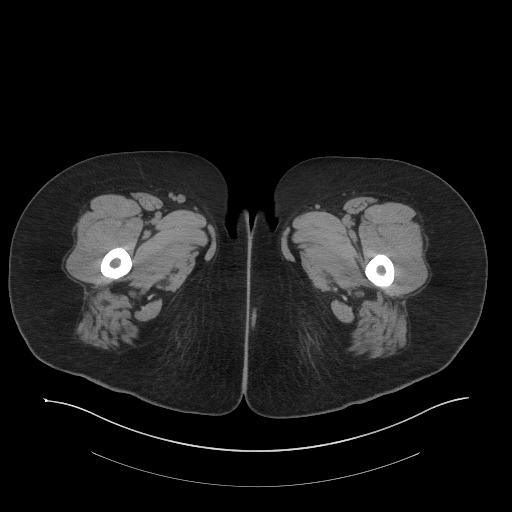
[im 5/112  bone]
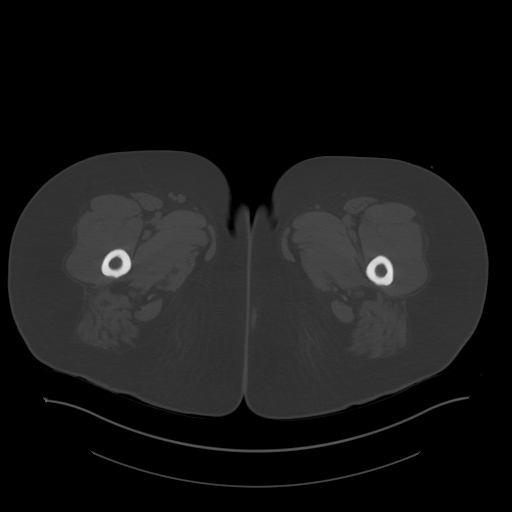
[im 14/112  soft-tissue]
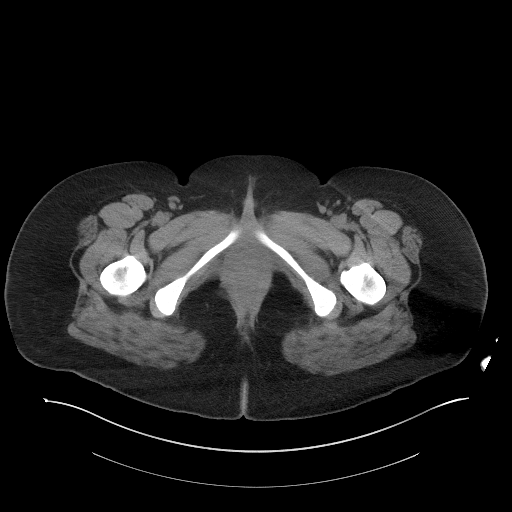
[im 23/112  soft-tissue]
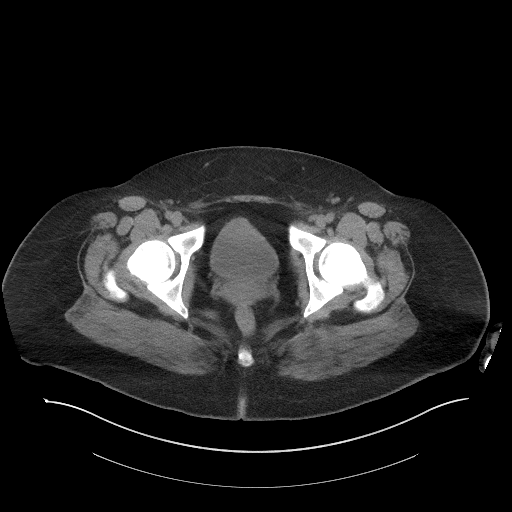
[im 32/112  soft-tissue]
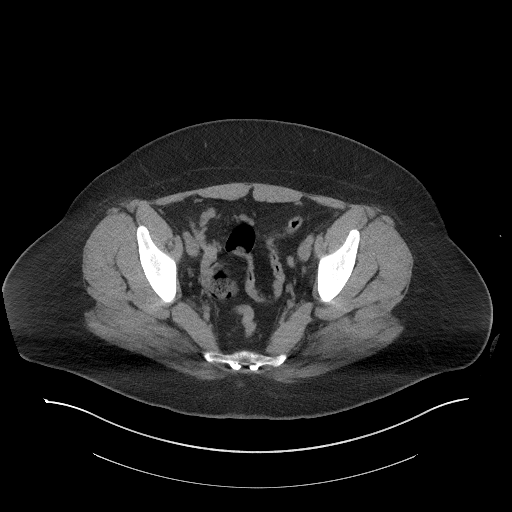
[im 36/112  soft-tissue]
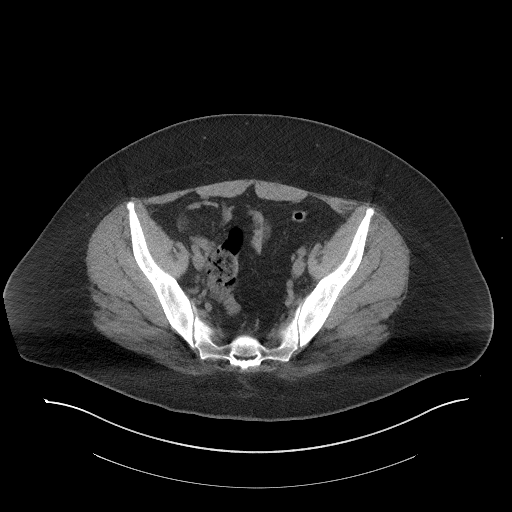
[im 45/112  soft-tissue]
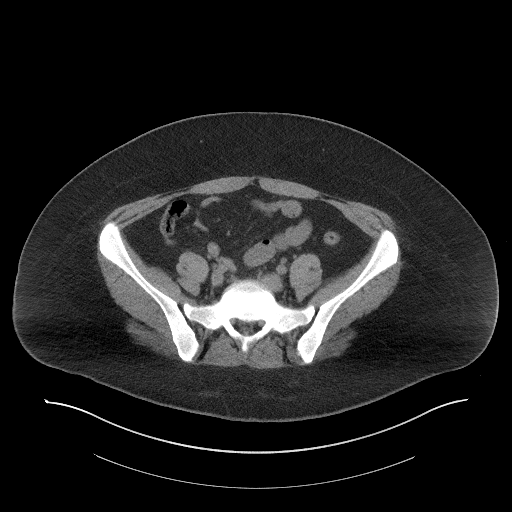
[im 54/112  soft-tissue]
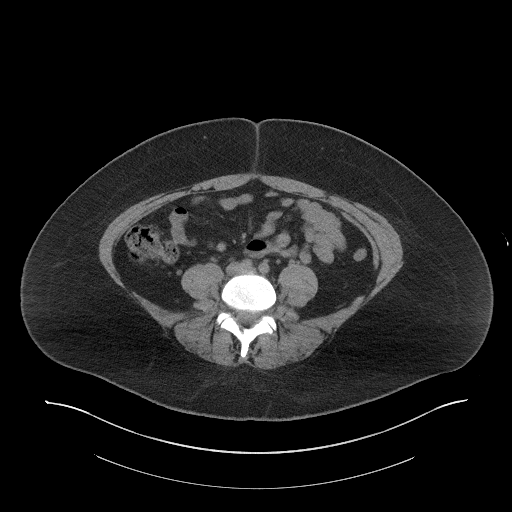
[im 58/112  soft-tissue]
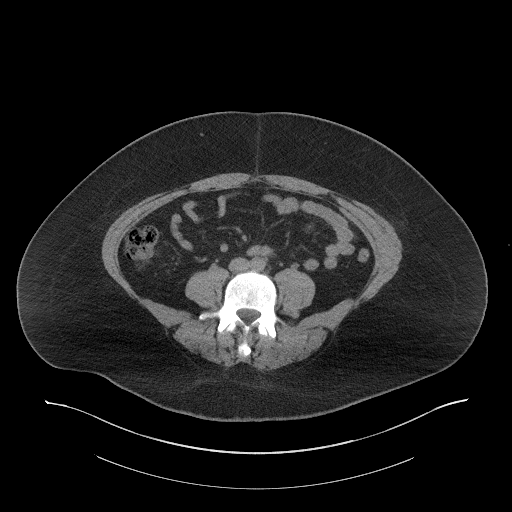
[im 67/112  soft-tissue]
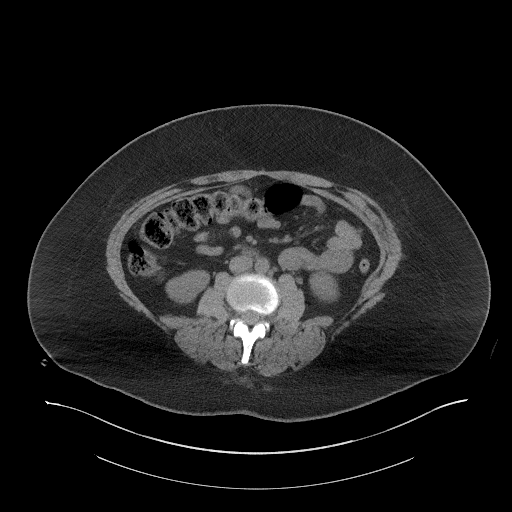
[im 67/112  bone]
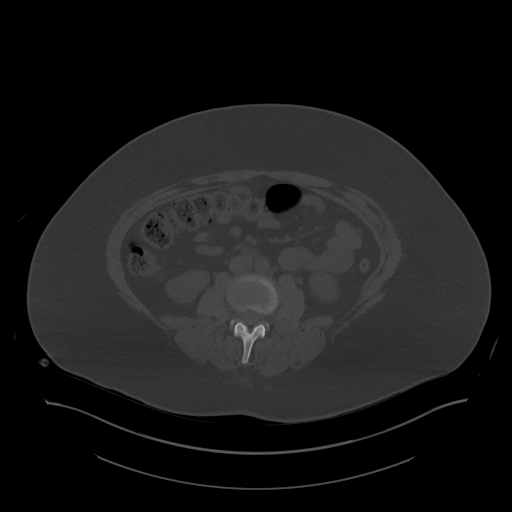
[im 76/112  soft-tissue]
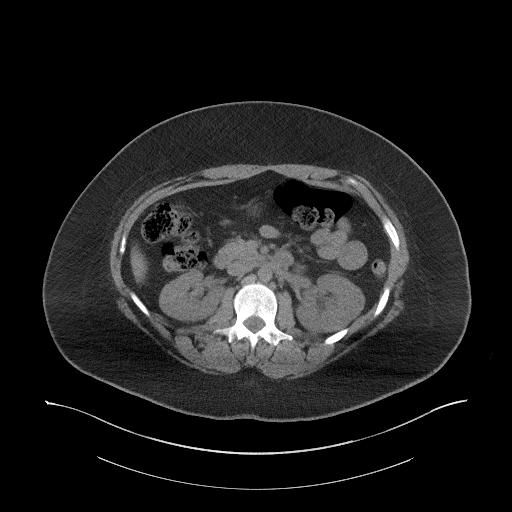
[im 85/112  soft-tissue]
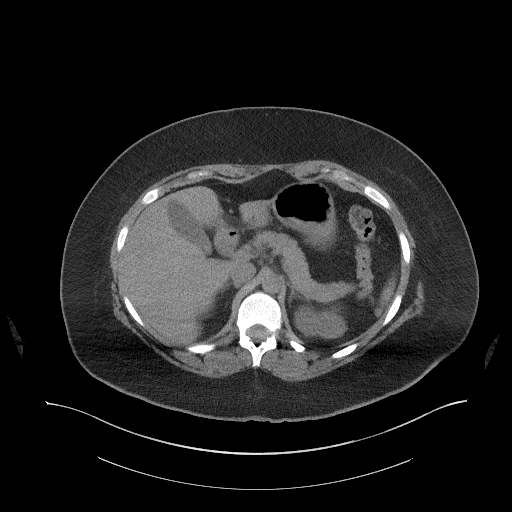
[im 89/112  soft-tissue]
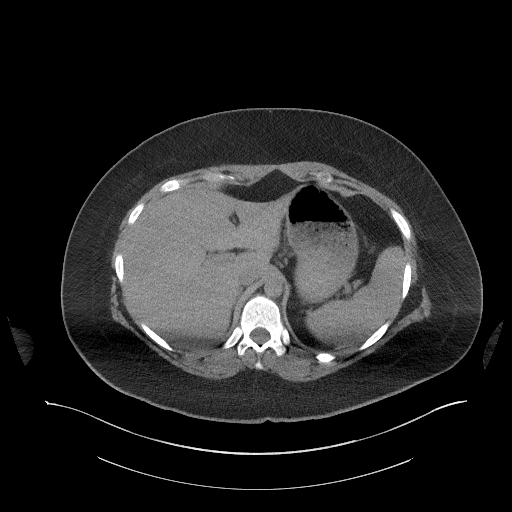
[im 98/112  soft-tissue]
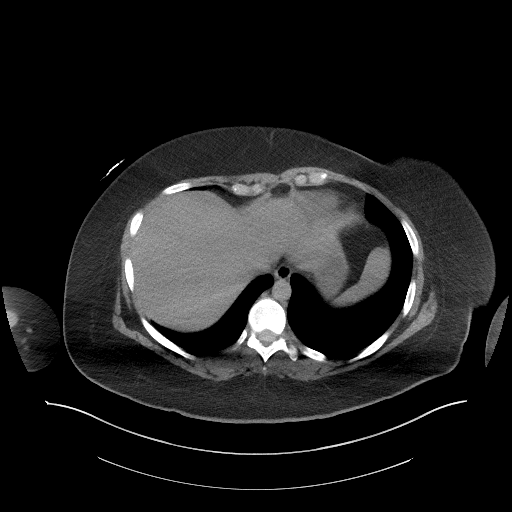
[im 107/112  soft-tissue]
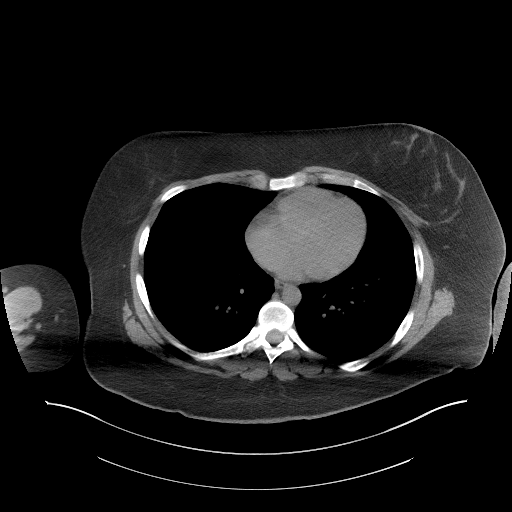

[Series 6: a/p w/o cor · coronal · non-contrast · 1.07mm/px · 3 of 182 slices shown]
[im 61/182  soft-tissue]
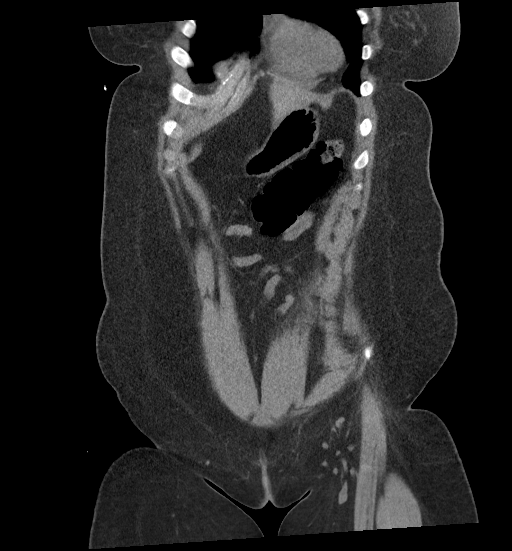
[im 81/182  soft-tissue]
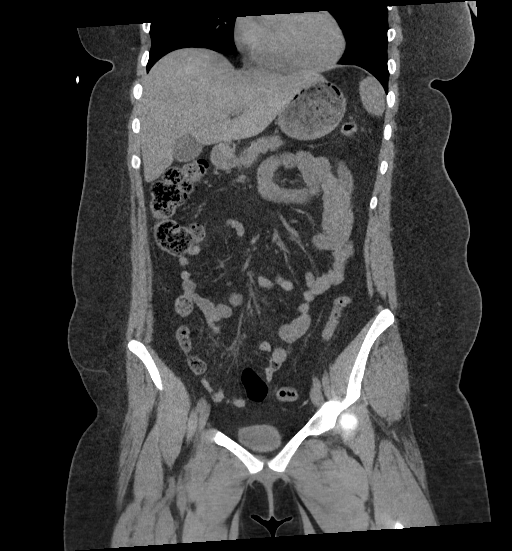
[im 101/182  soft-tissue]
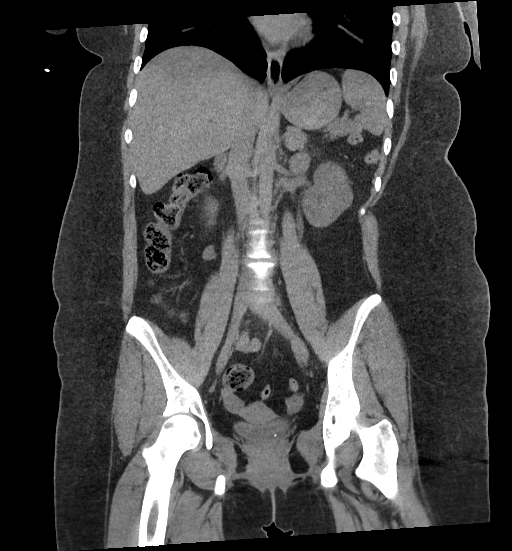

[17 of 46 positions shown; findings below may reference images not displayed]

FINDINGS: Lower chest: Lung bases are clear.

Hepatobiliary: No focal liver abnormality is seen. No gallstones,
gallbladder wall thickening, or biliary dilatation.

Pancreas: No ductal dilatation or inflammation.

Spleen: Normal in size without focal abnormality. Splenule
anteriorly.

Adrenals/Urinary Tract: Normal adrenal glands.

Punctate obstructing stone at the left ureterovesicular junction
with mild hydroureteronephrosis and perinephric edema. No additional
nonobstructing calculi. No right hydronephrosis. Right ureter is
decompressed. Urinary bladder partially distended.

Stomach/Bowel: Stomach is within normal limits. Appendix appears
normal. No evidence of bowel wall thickening, distention, or
inflammatory changes.

Vascular/Lymphatic: Unremarkable noncontrast vascular structures.
Few prominent ileocolic nodes likely reactive.

Reproductive: Uterus and bilateral adnexa are unremarkable.

Other: No free air, free fluid, or intra-abdominal fluid collection.
Tiny fat containing umbilical hernia.

Musculoskeletal: There are no acute or suspicious osseous
abnormalities.
IMPRESSION: Punctate obstructing stone at the left ureterovesicular junction
with mild hydronephrosis.

## 2021-07-12 ENCOUNTER — Encounter: Payer: Self-pay | Admitting: Family Medicine

## 2021-07-13 ENCOUNTER — Telehealth: Payer: Self-pay

## 2021-07-13 ENCOUNTER — Other Ambulatory Visit: Payer: Self-pay

## 2021-07-13 MED ORDER — BUPROPION HCL ER (XL) 150 MG PO TB24: 150.0000 mg | ORAL_TABLET | Freq: Every day | ORAL | 0 refills | Status: DC

## 2021-07-13 MED ORDER — ESCITALOPRAM OXALATE 20 MG PO TABS: 20.0000 mg | ORAL_TABLET | Freq: Every day | ORAL | 0 refills | Status: DC

## 2021-07-13 NOTE — Telephone Encounter (Signed)
Pt called back and indicated she has an appt for next week and felt safe to wait that long to be seen.  Meds will be refilled at her request

## 2021-07-13 NOTE — Telephone Encounter (Signed)
Left vm for patient to return my call in regards to her my chart message. Dr Beverely Low is aware that she called about her depression. We need to see her for at least a virtual and advise her of what to do this weekend in case it's an emergency

## 2021-07-13 NOTE — Telephone Encounter (Signed)
Patient returned my call, I confirmed that she would be ok to wait until her appt for next Thursday, She confirmed that she would be. Asked that we refill her wellbutrin and lexapro, those were sent to the pharmacy

## 2021-07-19 ENCOUNTER — Encounter: Payer: Self-pay | Admitting: Family Medicine

## 2021-07-19 ENCOUNTER — Telehealth (INDEPENDENT_AMBULATORY_CARE_PROVIDER_SITE_OTHER): Payer: Managed Care, Other (non HMO) | Admitting: Family Medicine

## 2021-07-19 ENCOUNTER — Other Ambulatory Visit: Payer: Self-pay

## 2021-07-19 VITALS — Ht 69.0 in | Wt 275.0 lb

## 2021-07-19 DIAGNOSIS — F32A Depression, unspecified: Secondary | ICD-10-CM | POA: Diagnosis not present

## 2021-07-19 DIAGNOSIS — F419 Anxiety disorder, unspecified: Secondary | ICD-10-CM

## 2021-07-19 MED ORDER — BUPROPION HCL ER (XL) 300 MG PO TB24: 300.0000 mg | ORAL_TABLET | Freq: Every day | ORAL | 3 refills | Status: DC

## 2021-07-19 NOTE — Progress Notes (Signed)
Virtual Visit via Video   I connected with patient on 07/19/21 at 11:30 AM EDT by a video enabled telemedicine application and verified that I am speaking with the correct person using two identifiers.  Location patient: Home Location provider: [place] [facility], Office Persons participating in the virtual visit: Patient, Provider, CMA Tresa Endo C)  I discussed the limitations of evaluation and management by telemedicine and the availability of in person appointments. The patient expressed understanding and agreed to proceed.  Subjective:   HPI:   Anxiety/Depression- currently on Wellbutrin 150mg  daily and Lexapro 20mg  daily.  Pt reports for the past month she doesn't 'want to get up and do anything'.  Increased irritability.  Not taking Xanax b/c she didn't notice any different.  No changes 1 month ago to job, relationship, home life.  'if i'm not at work, i'm sleeping'.  Was previously on Pristiq and Viibryd.  Had bad reaction to Sertraline- 'i was mean'.  Also on Prozac in the past and Buspar.    ROS:   See pertinent positives and negatives per HPI.  Patient Active Problem List   Diagnosis Date Noted   Morbid obesity (HCC) 03/23/2020   Physical exam 03/25/2019   Birth control counseling 04/08/2017   Anxiety and depression 09/03/2016    Social History   Tobacco Use   Smoking status: Never   Smokeless tobacco: Never  Substance Use Topics   Alcohol use: No    Current Outpatient Medications:    ALPRAZolam (XANAX) 0.5 MG tablet, Take 1 tablet (0.5 mg total) by mouth 2 (two) times daily as needed for anxiety., Disp: 30 tablet, Rfl: 1   buPROPion (WELLBUTRIN XL) 150 MG 24 hr tablet, Take 1 tablet (150 mg total) by mouth daily., Disp: 90 tablet, Rfl: 0   dicyclomine (BENTYL) 20 MG tablet, TAKE ONE TABLET BY MOUTH THREE TIMES A DAY AS NEEDED FOR SPASMS, Disp: 30 tablet, Rfl: 0   EPINEPHrine (EPIPEN 2-PAK) 0.3 mg/0.3 mL IJ SOAJ injection, Inject 0.3 mg into the muscle as needed  for anaphylaxis., Disp: 1 each, Rfl: 0   escitalopram (LEXAPRO) 20 MG tablet, Take 1 tablet (20 mg total) by mouth daily., Disp: 30 tablet, Rfl: 0   norethindrone-ethinyl estradiol (JUNEL 1/20) 1-20 MG-MCG tablet, Take 1 tablet by mouth daily., Disp: 63 tablet, Rfl: 0   phentermine 37.5 MG capsule, Take 37.5 mg by mouth every morning. (Patient not taking: Reported on 07/19/2021), Disp: , Rfl:   Allergies  Allergen Reactions   Methylprednisolone Hives   Latex Hives and Rash   Penicillin G Rash    Did it involve swelling of the face/tongue/throat, SOB, or low BP? Yes Did it involve sudden or severe rash/hives, skin peeling, or any reaction on the inside of your mouth or nose? Unk Did you need to seek medical attention at a hospital or doctor's office? Unk When did it last happen? "happened when I was a baby" If all above answers are "NO", may proceed with cephalosporin use.    Shellfish-Derived Products Hives and Rash    Objective:   Ht 5\' 9"  (1.753 m) Comment: pt reported  Wt 275 lb (124.7 kg) Comment: pt reported  BMI 40.61 kg/m  AAOx3, NAD NCAT, EOMI No obvious CN deficits Coloring WNL Pt is able to speak clearly, coherently without shortness of breath or increased work of breathing.  Thought process is linear.  Mood is appropriate.   Assessment and Plan:   Anxiety/Depression- deteriorated.  Pt reports that sxs have worsened  over the last month.  No motivation, no energy, increased irritability.  She has been on multiple medications in the past w/ varying degrees of tolerability.  Rather than add a new medication in the mix at this time, will increase the Wellbutrin dose to 300mg  daily.  Continue Lexapro at 20mg  daily.  Encouraged addition of methyl folate supplement.  Will follow.   , MD 07/19/2021

## 2021-07-19 NOTE — Progress Notes (Signed)
I connected with  Stefanie Farrell on 07/19/21 by a video enabled telemedicine application and verified that I am speaking with the correct person using two identifiers.   I discussed the limitations of evaluation and management by telemedicine. The patient expressed understanding and agreed to proceed.

## 2021-08-14 ENCOUNTER — Other Ambulatory Visit: Payer: Self-pay

## 2021-08-14 DIAGNOSIS — Z3009 Encounter for other general counseling and advice on contraception: Secondary | ICD-10-CM

## 2021-08-14 MED ORDER — NORETHINDRONE ACET-ETHINYL EST 1-20 MG-MCG PO TABS: 1.0000 | ORAL_TABLET | Freq: Every day | ORAL | 0 refills | Status: DC

## 2021-08-23 ENCOUNTER — Other Ambulatory Visit: Payer: Self-pay

## 2021-08-23 DIAGNOSIS — F32A Depression, unspecified: Secondary | ICD-10-CM

## 2021-08-23 DIAGNOSIS — F419 Anxiety disorder, unspecified: Secondary | ICD-10-CM

## 2021-08-23 MED ORDER — ESCITALOPRAM OXALATE 20 MG PO TABS: 20.0000 mg | ORAL_TABLET | Freq: Every day | ORAL | 0 refills | Status: DC

## 2021-08-27 ENCOUNTER — Other Ambulatory Visit: Payer: Self-pay | Admitting: Family Medicine

## 2021-08-27 MED ORDER — ALPRAZOLAM 0.5 MG PO TABS
0.5000 mg | ORAL_TABLET | Freq: Two times a day (BID) | ORAL | 1 refills | Status: DC | PRN
Start: 2021-08-27 — End: 2021-08-27

## 2021-08-27 MED ORDER — ALPRAZOLAM 0.5 MG PO TABS
0.5000 mg | ORAL_TABLET | Freq: Two times a day (BID) | ORAL | 1 refills | Status: DC | PRN
Start: 2021-08-27 — End: 2022-03-06

## 2021-11-21 ENCOUNTER — Other Ambulatory Visit: Payer: Self-pay

## 2021-11-21 DIAGNOSIS — F419 Anxiety disorder, unspecified: Secondary | ICD-10-CM

## 2021-11-21 MED ORDER — ESCITALOPRAM OXALATE 20 MG PO TABS: 20.0000 mg | ORAL_TABLET | Freq: Every day | ORAL | 0 refills | Status: DC

## 2021-11-22 ENCOUNTER — Ambulatory Visit: Payer: Managed Care, Other (non HMO) | Admitting: Family Medicine

## 2022-02-07 ENCOUNTER — Encounter: Payer: Self-pay | Admitting: Family Medicine

## 2022-02-20 ENCOUNTER — Other Ambulatory Visit: Payer: Self-pay

## 2022-02-20 DIAGNOSIS — F32A Depression, unspecified: Secondary | ICD-10-CM

## 2022-02-20 MED ORDER — ESCITALOPRAM OXALATE 20 MG PO TABS: 20.0000 mg | ORAL_TABLET | Freq: Every day | ORAL | 0 refills | Status: DC

## 2022-03-05 ENCOUNTER — Telehealth: Payer: Self-pay

## 2022-03-06 MED ORDER — ALPRAZOLAM 0.5 MG PO TABS: 0.5000 mg | ORAL_TABLET | Freq: Two times a day (BID) | ORAL | 1 refills | Status: AC | PRN

## 2022-03-06 NOTE — Telephone Encounter (Signed)
Prescription filled at pt's request ?

## 2022-05-07 ENCOUNTER — Other Ambulatory Visit: Payer: Self-pay

## 2022-05-07 DIAGNOSIS — F419 Anxiety disorder, unspecified: Secondary | ICD-10-CM

## 2022-05-07 MED ORDER — ESCITALOPRAM OXALATE 20 MG PO TABS: 20.0000 mg | ORAL_TABLET | Freq: Every day | ORAL | 0 refills | Status: DC

## 2022-05-24 ENCOUNTER — Other Ambulatory Visit: Payer: Self-pay

## 2022-05-24 DIAGNOSIS — F419 Anxiety disorder, unspecified: Secondary | ICD-10-CM

## 2022-05-24 MED ORDER — ESCITALOPRAM OXALATE 20 MG PO TABS: 20.0000 mg | ORAL_TABLET | Freq: Every day | ORAL | 3 refills | Status: DC

## 2022-09-26 ENCOUNTER — Encounter (INDEPENDENT_AMBULATORY_CARE_PROVIDER_SITE_OTHER): Payer: Managed Care, Other (non HMO) | Admitting: Family Medicine

## 2022-09-26 DIAGNOSIS — R058 Other specified cough: Secondary | ICD-10-CM

## 2022-09-27 MED ORDER — PREDNISONE 10 MG PO TABS: ORAL_TABLET | ORAL | 0 refills | Status: DC

## 2022-09-27 MED ORDER — ALBUTEROL SULFATE HFA 108 (90 BASE) MCG/ACT IN AERS: 2.0000 | INHALATION_SPRAY | Freq: Four times a day (QID) | RESPIRATORY_TRACT | 0 refills | Status: DC | PRN

## 2022-09-27 NOTE — Telephone Encounter (Signed)
[facility] VISIT   Patient agreed to [facility] visit and is aware that copayment and coinsurance may apply. Patient was treated using telemedicine according to accepted telemedicine protocols.  Subjective:   Patient complains of cough  Patient Active Problem List   Diagnosis Date Noted   Morbid obesity (HCC) 03/23/2020   Physical exam 03/25/2019   Birth control counseling 04/08/2017   Anxiety and depression 09/03/2016   Social History   Tobacco Use   Smoking status: Never   Smokeless tobacco: Never  Substance Use Topics   Alcohol use: No    Current Outpatient Medications:    albuterol (VENTOLIN HFA) 108 (90 Base) MCG/ACT inhaler, Inhale 2 puffs into the lungs every 6 (six) hours as needed for wheezing or shortness of breath., Disp: 8 g, Rfl: 0   predniSONE (DELTASONE) 10 MG tablet, 3 tabs x3 days and then 2 tabs x3 days and then 1 tab x3 days.  Take w/ food., Disp: 18 tablet, Rfl: 0   ALPRAZolam (XANAX) 0.5 MG tablet, Take 1 tablet (0.5 mg total) by mouth 2 (two) times daily as needed for anxiety., Disp: 30 tablet, Rfl: 1   buPROPion (WELLBUTRIN XL) 300 MG 24 hr tablet, Take 1 tablet (300 mg total) by mouth daily., Disp: 30 tablet, Rfl: 3   dicyclomine (BENTYL) 20 MG tablet, TAKE ONE TABLET BY MOUTH THREE TIMES A DAY AS NEEDED FOR SPASMS, Disp: 30 tablet, Rfl: 0   EPINEPHrine (EPIPEN 2-PAK) 0.3 mg/0.3 mL IJ SOAJ injection, Inject 0.3 mg into the muscle as needed for anaphylaxis., Disp: 1 each, Rfl: 0   escitalopram (LEXAPRO) 20 MG tablet, Take 1 tablet (20 mg total) by mouth daily., Disp: 30 tablet, Rfl: 3   norethindrone-ethinyl estradiol (JUNEL 1/20) 1-20 MG-MCG tablet, Take 1 tablet by mouth daily., Disp: 63 tablet, Rfl: 0   phentermine 37.5 MG capsule, Take 37.5 mg by mouth every morning. (Patient not taking: Reported on 07/19/2021), Disp: , Rfl:   Allergies  Allergen Reactions   Methylprednisolone Hives   Latex Hives and Rash   Penicillin G Rash    Did it involve swelling of  the face/tongue/throat, SOB, or low BP? Yes Did it involve sudden or severe rash/hives, skin peeling, or any reaction on the inside of your mouth or nose? Unk Did you need to seek medical attention at a hospital or doctor's office? Unk When did it last happen? "happened when I was a baby" If all above answers are "NO", may proceed with cephalosporin use.    Shellfish-Derived Products Hives and Rash    Assessment and Plan:   Diagnosis: postviral cough. Please see myChart communication and orders below.   No orders of the defined types were placed in this encounter.  Meds ordered this encounter  Medications   predniSONE (DELTASONE) 10 MG tablet    Sig: 3 tabs x3 days and then 2 tabs x3 days and then 1 tab x3 days.  Take w/ food.    Dispense:  18 tablet    Refill:  0   albuterol (VENTOLIN HFA) 108 (90 Base) MCG/ACT inhaler    Sig: Inhale 2 puffs into the lungs every 6 (six) hours as needed for wheezing or shortness of breath.    Dispense:  8 g    Refill:  0    Neena Rhymes, MD 09/27/2022  A total of 8 minutes were spent by me to personally review the patient-generated inquiry, review patient records and data pertinent to assessment of the patient's problem, develop a  management plan including generation of prescriptions and/or orders, and on subsequent communication with the patient through secure the MyChart portal service.   There is no separately reported E/M service related to this service in the past 7 days nor does the patient have an upcoming soonest available appointment for this issue. This work was completed in less than 7 days.   The patient consented to this service today (see patient agreement prior to ongoing communication). Patient counseled regarding the need for in-person exam for certain conditions and was advised to call the office if any changing or worsening symptoms occur.   The codes to be used for the E/M service are: [x]   99421 for 5-10 minutes of time  spent on the inquiry. []   for 11-20 minutes. []   for 21+ minutes.

## 2022-09-27 NOTE — Addendum Note (Signed)
Addended by: Sheliah Hatch on: 09/27/2022 11:54 AM   Modules accepted: Orders

## 2022-09-30 ENCOUNTER — Other Ambulatory Visit: Payer: Self-pay

## 2022-09-30 ENCOUNTER — Emergency Department (HOSPITAL_BASED_OUTPATIENT_CLINIC_OR_DEPARTMENT_OTHER): Payer: Managed Care, Other (non HMO) | Admitting: Radiology

## 2022-09-30 ENCOUNTER — Emergency Department (HOSPITAL_BASED_OUTPATIENT_CLINIC_OR_DEPARTMENT_OTHER)
Admission: EM | Admit: 2022-09-30 | Discharge: 2022-10-01 | Discharge status: Against Medical Advice | Payer: Managed Care, Other (non HMO) | Attending: Emergency Medicine | Admitting: Emergency Medicine

## 2022-09-30 ENCOUNTER — Encounter (HOSPITAL_BASED_OUTPATIENT_CLINIC_OR_DEPARTMENT_OTHER): Payer: Self-pay

## 2022-09-30 DIAGNOSIS — Z20822 Contact with and (suspected) exposure to covid-19: Secondary | ICD-10-CM | POA: Insufficient documentation

## 2022-09-30 DIAGNOSIS — J988 Other specified respiratory disorders: Secondary | ICD-10-CM | POA: Diagnosis not present

## 2022-09-30 DIAGNOSIS — Z5321 Procedure and treatment not carried out due to patient leaving prior to being seen by health care provider: Secondary | ICD-10-CM | POA: Insufficient documentation

## 2022-09-30 DIAGNOSIS — B974 Respiratory syncytial virus as the cause of diseases classified elsewhere: Secondary | ICD-10-CM | POA: Diagnosis not present

## 2022-09-30 DIAGNOSIS — R059 Cough, unspecified: Secondary | ICD-10-CM | POA: Diagnosis present

## 2022-09-30 LAB — PREGNANCY, URINE: Preg Test, Ur: NEGATIVE

## 2022-09-30 LAB — RESP PANEL BY RT-PCR (RSV, FLU A&B, COVID)  RVPGX2
Influenza A by PCR: NEGATIVE
Influenza B by PCR: NEGATIVE
Resp Syncytial Virus by PCR: POSITIVE — AB
SARS Coronavirus 2 by RT PCR: NEGATIVE

## 2022-09-30 NOTE — ED Triage Notes (Signed)
Pt states that she has been sick with sinus congestion x 3-4 weeks. Pt states that cough, fever, headache, and bodyaches have worsened x 3 days.

## 2022-10-01 ENCOUNTER — Ambulatory Visit: Payer: Managed Care, Other (non HMO) | Admitting: Family

## 2022-10-01 ENCOUNTER — Encounter: Payer: Self-pay | Admitting: Family

## 2022-10-01 VITALS — BP 128/78 | HR 112 | Temp 98.7°F | Ht 69.0 in | Wt 284.8 lb

## 2022-10-01 DIAGNOSIS — H671 Otitis media in diseases classified elsewhere, right ear: Secondary | ICD-10-CM

## 2022-10-01 DIAGNOSIS — J210 Acute bronchiolitis due to respiratory syncytial virus: Secondary | ICD-10-CM | POA: Diagnosis not present

## 2022-10-01 MED ORDER — SULFAMETHOXAZOLE-TRIMETHOPRIM 800-160 MG PO TABS: 1.0000 | ORAL_TABLET | Freq: Two times a day (BID) | ORAL | 0 refills | Status: DC

## 2022-10-01 MED ORDER — PROMETHAZINE-DM 6.25-15 MG/5ML PO SYRP: 5.0000 mL | ORAL_SOLUTION | Freq: Four times a day (QID) | ORAL | 0 refills | Status: DC | PRN

## 2022-10-03 ENCOUNTER — Encounter: Payer: Self-pay | Admitting: Family

## 2022-10-03 NOTE — Progress Notes (Signed)
Acute Office Visit  Subjective:     Patient ID: Stefanie Farrell, female    DOB: 1995/10/26, 26 y.o.   MRN: [id number]  Chief Complaint  Patient presents with   Cough    Pt went to the emergency room last nigh and tested positive for RSV     HPI Patient is in today with c/o cough, congestion, sinus pressure and ear pain x 3 days. Patient went to the ED last night and tested positive for RSV but did not stay to be seen by a provider. She is concerned about her ear.    Review of Systems  Constitutional:  Positive for malaise/fatigue.  HENT:  Positive for congestion, ear pain and sinus pain.   Respiratory: Negative.    Cardiovascular: Negative.   Gastrointestinal: Negative.   Musculoskeletal: Negative.   Neurological: Negative.   Endo/Heme/Allergies: Negative.   Psychiatric/Behavioral: Negative.    All other systems reviewed and are negative.  Past Medical History:  Diagnosis Date   Anxiety    Depression     Social History   Socioeconomic History   Marital status: Married    Spouse name: Not on file   Number of children: Not on file   Years of education: Not on file   Highest education level: Not on file  Occupational History   Not on file  Tobacco Use   Smoking status: Never   Smokeless tobacco: Never  Vaping Use   Vaping Use: Never used  Substance and Sexual Activity   Alcohol use: No   Drug use: No   Sexual activity: Yes    Birth control/protection: Pill, Condom  Other Topics Concern   Not on file  Social History Narrative   Not on file   Social Determinants of Health   Financial Resource Strain: Not on file  Food Insecurity: Not on file  Transportation Needs: Not on file  Physical Activity: Not on file  Stress: Not on file  Social Connections: Not on file  Intimate Partner Violence: Not on file    Past Surgical History:  Procedure Laterality Date   TONSILLECTOMY AND ADENOIDECTOMY      History reviewed. No pertinent family  history.  Allergies  Allergen Reactions   Methylprednisolone Hives   Penicillins Other (See Comments)   Latex Hives and Rash   Penicillin G Rash    Did it involve swelling of the face/tongue/throat, SOB, or low BP? Yes Did it involve sudden or severe rash/hives, skin peeling, or any reaction on the inside of your mouth or nose? Unk Did you need to seek medical attention at a hospital or doctor's office? Unk When did it last happen? "happened when I was a baby" If all above answers are "NO", may proceed with cephalosporin use.    Shellfish-Derived Products Hives and Rash    Current Outpatient Medications on File Prior to Visit  Medication Sig Dispense Refill   albuterol (VENTOLIN HFA) 108 (90 Base) MCG/ACT inhaler Inhale 2 puffs into the lungs every 6 (six) hours as needed for wheezing or shortness of breath. 8 g 0   EPINEPHrine (EPIPEN 2-PAK) 0.3 mg/0.3 mL IJ SOAJ injection Inject 0.3 mg into the muscle as needed for anaphylaxis. 1 each 0   escitalopram (LEXAPRO) 20 MG tablet Take 1 tablet (20 mg total) by mouth daily. 30 tablet 3   ALPRAZolam (XANAX) 0.5 MG tablet Take 1 tablet (0.5 mg total) by mouth 2 (two) times daily as needed for anxiety. (Patient not taking: Reported on  10/01/2022) 30 tablet 1   buPROPion (WELLBUTRIN XL) 300 MG 24 hr tablet Take 1 tablet (300 mg total) by mouth daily. 30 tablet 3   dicyclomine (BENTYL) 20 MG tablet TAKE ONE TABLET BY MOUTH THREE TIMES A DAY AS NEEDED FOR SPASMS (Patient not taking: Reported on 10/01/2022) 30 tablet 0   norethindrone-ethinyl estradiol (JUNEL 1/20) 1-20 MG-MCG tablet Take 1 tablet by mouth daily. (Patient not taking: Reported on 10/01/2022) 63 tablet 0   phentermine 37.5 MG capsule Take 37.5 mg by mouth every morning. (Patient not taking: Reported on 07/19/2021)     predniSONE (DELTASONE) 10 MG tablet 3 tabs x3 days and then 2 tabs x3 days and then 1 tab x3 days.  Take w/ food. (Patient not taking: Reported on 10/01/2022) 18 tablet 0    No current facility-administered medications on file prior to visit.    BP 128/78   Pulse (!) 112   Temp 98.7 F (37.1 C)   Ht 5\' 9"  (1.753 m)   Wt 284 lb 12.8 oz (129.2 kg)   LMP 09/23/2022 (Exact Date)   SpO2 95%   BMI 42.06 kg/m chart      Objective:    BP 128/78   Pulse (!) 112   Temp 98.7 F (37.1 C)   Ht 5\' 9"  (1.753 m)   Wt 284 lb 12.8 oz (129.2 kg)   LMP 09/23/2022 (Exact Date)   SpO2 95%   BMI 42.06 kg/m    Physical Exam Vitals and nursing note reviewed.  Constitutional:      Appearance: Normal appearance.  HENT:     Left Ear: Tympanic membrane, ear canal and external ear normal.     Ears:     Comments: Right TM red, bulging Cardiovascular:     Rate and Rhythm: Normal rate and regular rhythm.  Pulmonary:     Effort: Pulmonary effort is normal.     Breath sounds: Normal breath sounds.  Musculoskeletal:        General: Normal range of motion.     Cervical back: Normal range of motion and neck supple.  Skin:    General: Skin is warm and dry.  Neurological:     General: No focal deficit present.     Mental Status: She is alert and oriented to person, place, and time.  Psychiatric:        Mood and Affect: Mood normal.        Behavior: Behavior normal.     No results found for any visits on 10/01/22.      Assessment & Plan:   Problem List Items Addressed This Visit   None Visit Diagnoses     RSV (acute bronchiolitis due to respiratory syncytial virus)    -  Primary   Relevant Medications   sulfamethoxazole-trimethoprim (BACTRIM DS) 800-160 MG tablet   Otitis media of right ear in disease classified elsewhere       Relevant Medications   sulfamethoxazole-trimethoprim (BACTRIM DS) 800-160 MG tablet       Meds ordered this encounter  Medications   sulfamethoxazole-trimethoprim (BACTRIM DS) 800-160 MG tablet    Sig: Take 1 tablet by mouth 2 (two) times daily.    Dispense:  20 tablet    Refill:  0   promethazine-dextromethorphan  (PROMETHAZINE-DM) 6.25-15 MG/5ML syrup    Sig: Take 5 mLs by mouth 4 (four) times daily as needed.    Dispense:  118 mL    Refill:  0   Due to allergies,  Bactrim prescribed. Call the office if symptoms worsen or persist. Recheck as scheduled and sooner as needed.  No follow-ups on file.  Eulis Foster, FNP

## 2022-10-19 ENCOUNTER — Other Ambulatory Visit: Payer: Self-pay | Admitting: Family Medicine

## 2022-10-28 ENCOUNTER — Other Ambulatory Visit: Payer: Self-pay

## 2022-10-28 DIAGNOSIS — F419 Anxiety disorder, unspecified: Secondary | ICD-10-CM

## 2022-10-28 MED ORDER — ESCITALOPRAM OXALATE 20 MG PO TABS: 20.0000 mg | ORAL_TABLET | Freq: Every day | ORAL | 3 refills | Status: DC

## 2023-02-19 ENCOUNTER — Ambulatory Visit: Payer: Managed Care, Other (non HMO) | Admitting: Family Medicine

## 2023-02-24 ENCOUNTER — Emergency Department (HOSPITAL_BASED_OUTPATIENT_CLINIC_OR_DEPARTMENT_OTHER): Payer: Managed Care, Other (non HMO) | Admitting: Radiology

## 2023-02-24 ENCOUNTER — Encounter (HOSPITAL_BASED_OUTPATIENT_CLINIC_OR_DEPARTMENT_OTHER): Payer: Self-pay

## 2023-02-24 ENCOUNTER — Emergency Department (HOSPITAL_BASED_OUTPATIENT_CLINIC_OR_DEPARTMENT_OTHER)
Admission: EM | Admit: 2023-02-24 | Discharge: 2023-02-24 | Disposition: A | Discharge status: Home / Self Care | Payer: Managed Care, Other (non HMO) | Attending: Emergency Medicine | Admitting: Emergency Medicine

## 2023-02-24 ENCOUNTER — Other Ambulatory Visit: Payer: Self-pay

## 2023-02-24 DIAGNOSIS — R21 Rash and other nonspecific skin eruption: Secondary | ICD-10-CM | POA: Diagnosis not present

## 2023-02-24 DIAGNOSIS — Z1152 Encounter for screening for COVID-19: Secondary | ICD-10-CM | POA: Insufficient documentation

## 2023-02-24 DIAGNOSIS — R059 Cough, unspecified: Secondary | ICD-10-CM | POA: Diagnosis present

## 2023-02-24 DIAGNOSIS — R0981 Nasal congestion: Secondary | ICD-10-CM | POA: Insufficient documentation

## 2023-02-24 DIAGNOSIS — R509 Fever, unspecified: Secondary | ICD-10-CM | POA: Diagnosis not present

## 2023-02-24 DIAGNOSIS — Z91040 Latex allergy status: Secondary | ICD-10-CM | POA: Insufficient documentation

## 2023-02-24 LAB — RESP PANEL BY RT-PCR (RSV, FLU A&B, COVID)  RVPGX2
Influenza A by PCR: NEGATIVE
Influenza B by PCR: NEGATIVE
Resp Syncytial Virus by PCR: NEGATIVE
SARS Coronavirus 2 by RT PCR: NEGATIVE

## 2023-02-24 LAB — GROUP A STREP BY PCR: Group A Strep by PCR: NOT DETECTED

## 2023-02-24 MED ORDER — DOXYCYCLINE HYCLATE 100 MG PO CAPS: 100.0000 mg | ORAL_CAPSULE | Freq: Two times a day (BID) | ORAL | 0 refills | Status: AC

## 2023-02-24 NOTE — Discharge Instructions (Signed)
If you continue having fevers in 2 days, I would recommend that he start taking the doxycycline antibiotic.  I think it is most likely that you are still experiencing a viral upper respiratory infection.  Viruses can cause fevers for 5 to 7 days, and generally the fevers will resolve, although the cough can linger beyond that.  I would recommend that you follow-up with your doctor's office this week if you are not feeling better.  This presentation did not look consistent with Lyme disease today.  However, if you continue having fevers at home, which do not resolve with antibiotics, or you develop a bull's-eye type rash on your arms or legs, please consult with your primary care doctor's office or return to the ER for further testing.

## 2023-02-24 NOTE — ED Notes (Signed)
Patient states that she has been bitten by three ticks recently, 1 about 3 months ago, 2 within the last month. She denies any bullseye marks around bites, but states the last ones really irritated her skin.

## 2023-02-24 NOTE — ED Provider Notes (Signed)
[place] EMERGENCY DEPARTMENT AT [facility] Provider Note   CSN: [id number] Arrival date & time: 02/24/23  1946     History  Chief Complaint  Patient presents with   Cough    Stefanie Farrell is a 27 y.o. female presented to ED with cough, congestion and fevers for 5 to 7 days.  Patient reports significant having nasal congestion and sinus drainage about 7 days ago.  She has had a persistent cough since then.  She reports for the past 5 days she has had persistent daily temperatures at home with fevers up to 101.36F, and 101F today.  She continues to have a cough.  She reports concern that she pulled 3 ticks off her body, although denies bull's-eye rash to me.  She denies any other medical issues.  HPI     Home Medications Prior to Admission medications   Medication Sig Start Date End Date Taking? Authorizing Provider  doxycycline (VIBRAMYCIN) 100 MG capsule Take 1 capsule (100 mg total) by mouth 2 (two) times daily for 7 days. 02/24/23 03/03/23 Yes Terald Sleeper, MD  albuterol (VENTOLIN HFA) 108 (90 Base) MCG/ACT inhaler TAKE 2 PUFFS BY MOUTH EVERY 6 HOURS AS NEEDED FOR WHEEZE OR SHORTNESS OF BREATH 10/21/22   Sheliah Hatch, MD  ALPRAZolam Prudy Feeler) 0.5 MG tablet Take 1 tablet (0.5 mg total) by mouth 2 (two) times daily as needed for anxiety. Patient not taking: Reported on 10/01/2022 03/06/22   Sheliah Hatch, MD  dicyclomine (BENTYL) 20 MG tablet TAKE ONE TABLET BY MOUTH THREE TIMES A DAY AS NEEDED FOR SPASMS Patient not taking: Reported on 10/01/2022 02/22/21   Sheliah Hatch, MD  EPINEPHrine (EPIPEN 2-PAK) 0.3 mg/0.3 mL IJ SOAJ injection Inject 0.3 mg into the muscle as needed for anaphylaxis. 05/30/21   Eulis Foster, FNP  escitalopram (LEXAPRO) 20 MG tablet Take 1 tablet (20 mg total) by mouth daily. 10/28/22   Sheliah Hatch, MD  predniSONE (DELTASONE) 10 MG tablet 3 tabs x3 days and then 2 tabs x3 days and then 1 tab x3 days.  Take w/ food. Patient  not taking: Reported on 10/01/2022 09/27/22   Sheliah Hatch, MD  promethazine-dextromethorphan (PROMETHAZINE-DM) 6.25-15 MG/5ML syrup Take 5 mLs by mouth 4 (four) times daily as needed. 10/01/22   Eulis Foster, FNP  sulfamethoxazole-trimethoprim (BACTRIM DS) 800-160 MG tablet Take 1 tablet by mouth 2 (two) times daily. 10/01/22   Worthy Rancher B, FNP      Allergies    Methylprednisolone, Penicillins, Latex, Penicillin g, and Shellfish-derived products    Review of Systems   Review of Systems  Physical Exam Updated Vital Signs BP 106/74   Pulse 76   Temp 98.4 F (36.9 C) (Oral)   Resp 18   Ht 5\' 9"  (1.753 m)   Wt 127 kg   SpO2 100%   BMI 41.35 kg/m  Physical Exam Constitutional:      General: She is not in acute distress.    Comments: Patient has audible nasal congestion and sniffling on exam, dry cough  HENT:     Head: Normocephalic and atraumatic.  Eyes:     Conjunctiva/sclera: Conjunctivae normal.     Pupils: Pupils are equal, round, and reactive to light.  Cardiovascular:     Rate and Rhythm: Normal rate and regular rhythm.  Pulmonary:     Effort: Pulmonary effort is normal. No respiratory distress.  Abdominal:     General: There is no distension.  Tenderness: There is no abdominal tenderness.  Skin:    General: Skin is warm and dry.     Comments: There is small circular erythematous rash on the left lower abdomen, there are tick bites that  Neurological:     General: No focal deficit present.     Mental Status: She is alert. Mental status is at baseline.  Psychiatric:        Mood and Affect: Mood normal.        Behavior: Behavior normal.     ED Results / Procedures / Treatments   Labs (all labs ordered are listed, but only abnormal results are displayed) Labs Reviewed  RESP PANEL BY RT-PCR (RSV, FLU A&B, COVID)  [id number]  GROUP A STREP BY PCR    EKG None  Radiology DG Chest 2 View  Result Date: 02/24/2023 CLINICAL DATA:  Shortness of  breath. EXAM: CHEST - 2 VIEW COMPARISON:  09/30/2022. FINDINGS: Clear lungs. Normal heart size and mediastinal contours. No pleural effusion or pneumothorax. Visualized bones and upper abdomen are unremarkable. IMPRESSION: No evidence of acute cardiopulmonary disease. Electronically Signed   By: Orvan Falconer M.D.   On: 02/24/2023 20:37    Procedures Procedures    Medications Ordered in ED Medications - No data to display  ED Course/ Medical Decision Making/ A&P                             Medical Decision Making Amount and/or Complexity of Data Reviewed Radiology: ordered.  Risk Prescription drug management.   Patient is here with complaint of fevers, chills, cough and congestion. Strongly suspect this is a viral URI, potential sinusitis, given the constellation of symptoms concentrated in her sinuses on exam.  I do not see evidence of bacterial pneumonia.  Her viral panels are negative here.  I do think with 5 days of symptoms be reasonable to give her a watch and wait prescription, advised that she wait another 2 days, but if she is having persistent fevers she begin taking the doxycycline.  I would be treating primarily for sinusitis.  I have a much lower suspicion is the new onset presentation of Lyme disease, specifically given her upper airway congestion symptoms.  She does not have a bull's-eye rash.        Final Clinical Impression(s) / ED Diagnoses Final diagnoses:  Sinus congestion  Fever, unspecified fever cause    Rx / DC Orders ED Discharge Orders          Ordered    doxycycline (VIBRAMYCIN) 100 MG capsule  2 times daily        02/24/23 2246              Terald Sleeper, MD 02/24/23 2318

## 2023-02-24 NOTE — ED Triage Notes (Signed)
Patient here POV from Home.  Endorses Fever that began 5 Days ago. Some associated Congestion, SOB, Productive Cough for approximately 5 Days.   Temperature was 101 today. Highest was 101.5. Tylenol and Ibuprofen today at 1900.   NAD Noted during Triage. A&Ox4. GCS 15. Ambulatory.

## 2023-08-30 ENCOUNTER — Encounter: Payer: Self-pay | Admitting: Family Medicine

## 2023-08-30 DIAGNOSIS — Z3009 Encounter for other general counseling and advice on contraception: Secondary | ICD-10-CM

## 2023-09-01 MED ORDER — NORETHINDRONE ACET-ETHINYL EST 1-20 MG-MCG PO TABS: 1.0000 | ORAL_TABLET | Freq: Every day | ORAL | 0 refills | Status: DC

## 2023-10-21 ENCOUNTER — Other Ambulatory Visit: Payer: Self-pay | Admitting: Family Medicine

## 2023-10-21 DIAGNOSIS — M62838 Other muscle spasm: Secondary | ICD-10-CM

## 2023-10-21 MED ORDER — DICYCLOMINE HCL 20 MG PO TABS: ORAL_TABLET | ORAL | 0 refills | Status: DC

## 2023-10-28 ENCOUNTER — Other Ambulatory Visit: Payer: Self-pay | Admitting: Family

## 2023-10-28 DIAGNOSIS — M62838 Other muscle spasm: Secondary | ICD-10-CM

## 2023-11-27 ENCOUNTER — Other Ambulatory Visit: Payer: Self-pay | Admitting: Family Medicine

## 2023-11-27 DIAGNOSIS — Z3009 Encounter for other general counseling and advice on contraception: Secondary | ICD-10-CM

## 2023-12-02 ENCOUNTER — Other Ambulatory Visit: Payer: Self-pay

## 2023-12-02 DIAGNOSIS — Z3009 Encounter for other general counseling and advice on contraception: Secondary | ICD-10-CM

## 2023-12-02 MED ORDER — NORETHINDRONE ACET-ETHINYL EST 1-20 MG-MCG PO TABS: 1.0000 | ORAL_TABLET | Freq: Every day | ORAL | 0 refills | Status: DC

## 2023-12-03 ENCOUNTER — Encounter: Payer: Self-pay | Admitting: Family Medicine

## 2023-12-03 ENCOUNTER — Telehealth (INDEPENDENT_AMBULATORY_CARE_PROVIDER_SITE_OTHER): Payer: Managed Care, Other (non HMO) | Admitting: Family Medicine

## 2023-12-03 DIAGNOSIS — Z3009 Encounter for other general counseling and advice on contraception: Secondary | ICD-10-CM | POA: Diagnosis not present

## 2023-12-03 DIAGNOSIS — Z124 Encounter for screening for malignant neoplasm of cervix: Secondary | ICD-10-CM

## 2023-12-03 DIAGNOSIS — F419 Anxiety disorder, unspecified: Secondary | ICD-10-CM | POA: Diagnosis not present

## 2023-12-03 DIAGNOSIS — F32A Depression, unspecified: Secondary | ICD-10-CM | POA: Diagnosis not present

## 2023-12-03 MED ORDER — NORETHINDRONE ACET-ETHINYL EST 1-20 MG-MCG PO TABS: 1.0000 | ORAL_TABLET | Freq: Every day | ORAL | 3 refills | Status: AC

## 2023-12-03 MED ORDER — ESCITALOPRAM OXALATE 10 MG PO TABS: 10.0000 mg | ORAL_TABLET | Freq: Every day | ORAL | 3 refills | Status: DC

## 2023-12-03 NOTE — Progress Notes (Signed)
Virtual Visit via Video   I connected with patient on 12/03/23 at  9:20 AM EST by a video enabled telemedicine application and verified that I am speaking with the correct person using two identifiers.  Location patient: Home Location provider: [facility], Office Persons participating in the virtual visit: Patient, Provider, CMA Archie Patten H)  I discussed the limitations of evaluation and management by telemedicine and the availability of in person appointments. The patient expressed understanding and agreed to proceed.  Subjective:   HPI:   Birth control- currently on Junel 1/20.  Pt reports this is working well.  Anxiety/Depression- pt is thinking about coming off the Lexapro.  This has her anxious as she has heard this can be difficult.    ROS:   See pertinent positives and negatives per HPI.  Patient Active Problem List   Diagnosis Date Noted   Morbid obesity (HCC) 03/23/2020   Physical exam 03/25/2019   Birth control counseling 04/08/2017   Anxiety and depression 09/03/2016   Recurrent tonsillitis 04/25/2016   Tonsillar and adenoid hypertrophy 04/25/2016   Chronic tonsillitis 04/11/2016   Right ankle pain 08/09/2014    Social History   Tobacco Use   Smoking status: Never   Smokeless tobacco: Never  Substance Use Topics   Alcohol use: Yes    Comment: Occ    Current Outpatient Medications:    albuterol (VENTOLIN HFA) 108 (90 Base) MCG/ACT inhaler, TAKE 2 PUFFS BY MOUTH EVERY 6 HOURS AS NEEDED FOR WHEEZE OR SHORTNESS OF BREATH, Disp: 8.5 each, Rfl: 1   ALPRAZolam (XANAX) 0.5 MG tablet, Take 1 tablet (0.5 mg total) by mouth 2 (two) times daily as needed for anxiety., Disp: 30 tablet, Rfl: 1   dicyclomine (BENTYL) 20 MG tablet, TAKE ONE TABLET BY MOUTH THREE TIMES A DAY AS NEEDED FOR SPASMS, Disp: 30 tablet, Rfl: 0   EPINEPHrine (EPIPEN 2-PAK) 0.3 mg/0.3 mL IJ SOAJ injection, Inject 0.3 mg into the muscle as needed for anaphylaxis., Disp: 1 each, Rfl: 0    escitalopram (LEXAPRO) 20 MG tablet, Take 1 tablet (20 mg total) by mouth daily., Disp: 90 tablet, Rfl: 3   norethindrone-ethinyl estradiol (JUNEL 1/20) 1-20 MG-MCG tablet, Take 1 tablet by mouth daily., Disp: 63 tablet, Rfl: 0   predniSONE (DELTASONE) 10 MG tablet, 3 tabs x3 days and then 2 tabs x3 days and then 1 tab x3 days.  Take w/ food. (Patient not taking: Reported on 12/03/2023), Disp: 18 tablet, Rfl: 0   promethazine-dextromethorphan (PROMETHAZINE-DM) 6.25-15 MG/5ML syrup, Take 5 mLs by mouth 4 (four) times daily as needed. (Patient not taking: Reported on 12/03/2023), Disp: 118 mL, Rfl: 0   sulfamethoxazole-trimethoprim (BACTRIM DS) 800-160 MG tablet, Take 1 tablet by mouth 2 (two) times daily. (Patient not taking: Reported on 12/03/2023), Disp: 20 tablet, Rfl: 0  Allergies  Allergen Reactions   Methylprednisolone Hives   Penicillins Other (See Comments)   Latex Hives and Rash   Penicillin G Rash    Did it involve swelling of the face/tongue/throat, SOB, or low BP? Yes Did it involve sudden or severe rash/hives, skin peeling, or any reaction on the inside of your mouth or nose? Unk Did you need to seek medical attention at a hospital or doctor's office? Unk When did it last happen? "happened when I was a baby" If all above answers are "NO", may proceed with cephalosporin use.    Shellfish-Derived Products Hives and Rash    Objective:   There were no vitals taken for  this visit. AAOx3, NAD NCAT, EOMI No obvious CN deficits Coloring WNL Pt is able to speak clearly, coherently without shortness of breath or increased work of breathing.  Thought process is linear.  Mood is appropriate.   Assessment and Plan:   Birth control- pt is pleased w/ her current medication and feels that this is the only pill she has been on that worked well.  No changes, refills provided.  Anxiety/depression- improving.  She feels that she wants to start weaning her medication but is fearful to do so.   Will decrease to 10mg  daily but had extensive talk that if at any point she feels she needs to hold or go back up, we can do that.  Pt expressed understanding and is in agreement w/ plan.    Neena Rhymes, MD 12/03/2023

## 2024-01-15 ENCOUNTER — Ambulatory Visit: Payer: Managed Care, Other (non HMO)

## 2024-01-15 ENCOUNTER — Other Ambulatory Visit (HOSPITAL_COMMUNITY)
Admission: RE | Admit: 2024-01-15 | Discharge: 2024-01-15 | Disposition: A | Discharge status: Home / Self Care | Source: Ambulatory Visit

## 2024-01-15 VITALS — BP 134/91 | HR 91 | Ht 69.0 in | Wt 269.0 lb

## 2024-01-15 DIAGNOSIS — Z1239 Encounter for other screening for malignant neoplasm of breast: Secondary | ICD-10-CM | POA: Diagnosis not present

## 2024-01-15 DIAGNOSIS — Z124 Encounter for screening for malignant neoplasm of cervix: Secondary | ICD-10-CM

## 2024-01-15 DIAGNOSIS — Z01419 Encounter for gynecological examination (general) (routine) without abnormal findings: Secondary | ICD-10-CM | POA: Insufficient documentation

## 2024-01-15 DIAGNOSIS — Z3041 Encounter for surveillance of contraceptive pills: Secondary | ICD-10-CM | POA: Diagnosis not present

## 2024-01-15 NOTE — Progress Notes (Signed)
 Junel use. Plans to stay on these. Needs refill.Skipped last inactive pills on purpose. Never had a PAP.

## 2024-01-15 NOTE — Progress Notes (Signed)
 GYNECOLOGY OFFICE VISIT NOTE-WELL WOMAN EXAM  History:   Stefanie Farrell is a 28 year old here today for establish care/annual exam.   Birth Control:  Oral Contraception; Junel-Satisfied  Reproductive Concerns Sexually Active: Not Currently Partners Type: Female Number of partners in last year: One STD Testing: Declines  Obstetrical History: [id number]  Gynecological History:  None. She reports she has never had a pap smear.   Vaginal/GU Concerns: Reports normal periods with "normal cramping" that she takes ibuprofen with relief.  She reports cycle lasts 5-7 days.  She reports spotting/breakthrough bleeding during the 1st 3 months after starting oral contraception. No issues currently.   Breast Concerns/Exams: She denies issues. Reports SBE. She endorses breast awareness. She reports maternal grandmother and great grandmother with breast cancer.  She denies other family history of breast, uterine, cervical, or ovarian cancer  Medical and Nutrition PCP: [facility]-Last visit in Feb Significant PMx: None Exercise: Walks dog daily. 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  DV/A: Denies Social Support: Endorses Employment: Sales promotion account executive  Past Medical History:  Diagnosis Date   Anxiety    Depression     Past Surgical History:  Procedure Laterality Date   TONSILLECTOMY AND ADENOIDECTOMY      The following portions of the patient's history were reviewed and updated as appropriate: allergies, current medications, past family history, past medical history, past social history, past surgical history and problem list.   Health Maintenance: Pap: Collected, Results Pending.  Mammogram: None.  Colonoscopy: None Review of Systems:  Pertinent items noted in HPI and remainder of comprehensive ROS otherwise negative.    Objective:    Physical Exam BP (!) 134/91   Pulse 91   Ht 5\' 9"  (1.753 m)    Wt 269 lb (122 kg)   LMP 11/17/2023 (Approximate)   BMI 39.72 kg/m  Physical Exam Vitals reviewed. Exam conducted with a chaperone present (Ginny, MA).  Constitutional:      Appearance: Normal appearance.  HENT:     Head: Normocephalic and atraumatic.  Eyes:     Conjunctiva/sclera: Conjunctivae normal.  Cardiovascular:     Rate and Rhythm: Normal rate and regular rhythm.  Pulmonary:     Effort: Pulmonary effort is normal. No respiratory distress.     Breath sounds: Normal breath sounds.  Abdominal:     General: Bowel sounds are normal.     Palpations: Abdomen is soft.     Tenderness: There is no abdominal tenderness.  Genitourinary:    General: Normal vulva.     Labia:        Right: No tenderness or lesion.        Left: No tenderness or lesion.      Vagina: Vaginal discharge (white mucoid, no apparent odor.) present.     Cervix: No cervical motion tenderness, friability or erythema.     Uterus: Not enlarged and not tender.      Comments: Pap collected with brush and spatula.  BME with no apparent uterine enlargement or tenderness, but difficult to assess d/t body habitus.  Musculoskeletal:        General: Normal range of motion.     Cervical back: Normal range of motion.  Skin:    General: Skin is warm and dry.  Neurological:     Mental Status: She is alert and oriented to person, place, and time.  Psychiatric:        Mood and  Affect: Mood normal.        Behavior: Behavior normal.      Labs and Imaging No results found for this or any previous visit (from the past week). No results found.   Assessment & Plan:  28 year old Female Annual Exam with Pap Smear Breast Exam Birth Control Maintenance   1. Well woman exam with routine gynecological exam -Exam performed and findings discussed. -Encouraged to utilize Mychart for reviewing of results, communication with office, and scheduling of appts. -Educated on AHA exercise recommendations of 30 minutes of moderate to  vigorous activity at least 5x/week.  2. Encounter for screening breast examination -CBE completed and normal. -Educated and encouraged to continue SBE with increased breast awareness including examination of breast for skin changes, moles, tenderness, etc.   3. Pap smear for cervical cancer screening -Educated on ASCCP guidelines regarding pap smear evaluation and frequency. -Informed of turnover time and provider/clinic policy on releasing results. -First pelvic exam for patient! -Introduced to instruments used during pelvic exam. -Guided through pelvic exam with provider and staff support.  -Pap smear collected with spatula and brush. -Discussed reasons for bimanual exam. -Bimanual exam performed.  4. Encounter for birth control pills maintenance -UTD on prescription. -Instructed to call office or send mychart message when refill needed.   Routine preventative health maintenance measures emphasized. Please refer to After Visit Summary for other counseling recommendations.   No follow-ups on file.      Cherre Robins, CNM 01/15/2024

## 2024-01-19 LAB — CYTOLOGY - PAP: Diagnosis: NEGATIVE

## 2024-02-09 ENCOUNTER — Other Ambulatory Visit: Payer: Self-pay

## 2024-02-09 MED ORDER — ESCITALOPRAM OXALATE 10 MG PO TABS: 10.0000 mg | ORAL_TABLET | Freq: Every day | ORAL | 3 refills | Status: DC

## 2024-02-20 ENCOUNTER — Ambulatory Visit: Admitting: Family Medicine

## 2024-02-26 ENCOUNTER — Encounter: Payer: Self-pay | Admitting: Family Medicine

## 2024-02-26 ENCOUNTER — Ambulatory Visit: Admitting: Family Medicine

## 2024-02-26 VITALS — BP 104/62 | HR 96 | Temp 98.4°F | Ht 69.0 in | Wt 270.5 lb

## 2024-02-26 DIAGNOSIS — I499 Cardiac arrhythmia, unspecified: Secondary | ICD-10-CM | POA: Diagnosis not present

## 2024-02-26 DIAGNOSIS — Z1159 Encounter for screening for other viral diseases: Secondary | ICD-10-CM

## 2024-02-26 DIAGNOSIS — R5383 Other fatigue: Secondary | ICD-10-CM

## 2024-02-26 DIAGNOSIS — Z114 Encounter for screening for human immunodeficiency virus [HIV]: Secondary | ICD-10-CM

## 2024-02-26 DIAGNOSIS — R0683 Snoring: Secondary | ICD-10-CM

## 2024-02-26 LAB — CBC WITH DIFFERENTIAL/PLATELET
Basophils Absolute: 0.1 10*3/uL (ref 0.0–0.1)
Basophils Relative: 1.3 % (ref 0.0–3.0)
Eosinophils Absolute: 0.3 10*3/uL (ref 0.0–0.7)
Eosinophils Relative: 4.7 % (ref 0.0–5.0)
HCT: 42.7 % (ref 36.0–46.0)
Hemoglobin: 14.3 g/dL (ref 12.0–15.0)
Lymphocytes Relative: 19.9 % (ref 12.0–46.0)
Lymphs Abs: 1.5 10*3/uL (ref 0.7–4.0)
MCHC: 33.5 g/dL (ref 30.0–36.0)
MCV: 91.7 fl (ref 78.0–100.0)
Monocytes Absolute: 0.6 10*3/uL (ref 0.1–1.0)
Monocytes Relative: 7.5 % (ref 3.0–12.0)
Neutro Abs: 4.9 10*3/uL (ref 1.4–7.7)
Neutrophils Relative %: 66.6 % (ref 43.0–77.0)
Platelets: 284 10*3/uL (ref 150.0–400.0)
RBC: 4.66 Mil/uL (ref 3.87–5.11)
RDW: 12.9 % (ref 11.5–15.5)
WBC: 7.4 10*3/uL (ref 4.0–10.5)

## 2024-02-26 LAB — BASIC METABOLIC PANEL WITH GFR
BUN: 10 mg/dL (ref 6–23)
CO2: 29 meq/L (ref 19–32)
Calcium: 9.6 mg/dL (ref 8.4–10.5)
Chloride: 105 meq/L (ref 96–112)
Creatinine, Ser: 0.9 mg/dL (ref 0.40–1.20)
GFR: 88.88 mL/min (ref >60.00)
Glucose, Bld: 98 mg/dL (ref 70–99)
Potassium: 3.9 meq/L (ref 3.5–5.1)
Sodium: 141 meq/L (ref 135–145)

## 2024-02-26 LAB — HEPATIC FUNCTION PANEL
ALT: 18 U/L (ref 0–35)
AST: 19 U/L (ref 0–37)
Albumin: 4.4 g/dL (ref 3.5–5.2)
Alkaline Phosphatase: 53 U/L (ref 39–117)
Bilirubin, Direct: 0.1 mg/dL (ref 0.0–0.3)
Total Bilirubin: 0.5 mg/dL (ref 0.2–1.2)
Total Protein: 6.9 g/dL (ref 6.0–8.3)

## 2024-02-26 LAB — TSH: TSH: 0.84 u[IU]/mL (ref 0.35–5.50)

## 2024-02-26 LAB — IBC + FERRITIN
Ferritin: 37.9 ng/mL (ref 10.0–291.0)
Iron: 87 ug/dL (ref 42–145)
Saturation Ratios: 20 % (ref 20.0–50.0)
TIBC: 435.4 ug/dL (ref 250.0–450.0)
Transferrin: 311 mg/dL (ref 212.0–360.0)

## 2024-02-26 LAB — B12 AND FOLATE PANEL
Folate: 12 ng/mL (ref >5.9)
Vitamin B-12: 372 pg/mL (ref 211–911)

## 2024-02-26 LAB — VITAMIN D 25 HYDROXY (VIT D DEFICIENCY, FRACTURES): VITD: 45.07 ng/mL (ref 30.00–100.00)

## 2024-02-26 NOTE — Patient Instructions (Signed)
 Follow up as needed or as scheduled We'll notify you of your lab results and make any changes if needed Keep up the good work on healthy diet and regular exercise- you're doing great! Your EKG shows a normal heart rhythm- this is good news! We'll call you to schedule your consult for possible sleep apnea If the labs don't show us  anything and if you end up not having sleep apnea, then we can talk about possibly switching the Lexapro  Call with any questions or concerns Hang in there!!!

## 2024-02-26 NOTE — Progress Notes (Signed)
   Subjective:    Patient ID: Stefanie Farrell, female    DOB: 04-28-1996, 28 y.o.   MRN: [id number]  HPI Fatigue- pt reports sxs for years but in the last few months she has had increased exhaustion.  States it doesn't matter how much or how little she sleeps, always feels tired.  Has made big changes to diet and is exercising regularly- down 40 lbs- and still doesn't feel better.  Pt has been told that she snores.  No mentioned breathing pauses or gasping for breath.  Denies CP, SOB.  Currently on Lexapro  and this caused a degree of fatigue in dad and sister but not to this extent.     Review of Systems For ROS see HPI     Objective:   Physical Exam Vitals reviewed.  Constitutional:      General: She is not in acute distress.    Appearance: Normal appearance. She is well-developed. She is obese. She is not ill-appearing.  HENT:     Head: Normocephalic and atraumatic.  Eyes:     Conjunctiva/sclera: Conjunctivae normal.     Pupils: Pupils are equal, round, and reactive to light.  Neck:     Thyroid : No thyromegaly.  Cardiovascular:     Rate and Rhythm: Normal rate and regular rhythm.     Pulses: Normal pulses.     Heart sounds: Normal heart sounds. No murmur heard. Pulmonary:     Effort: Pulmonary effort is normal. No respiratory distress.     Breath sounds: Normal breath sounds.  Abdominal:     General: There is no distension.     Palpations: Abdomen is soft.     Tenderness: There is no abdominal tenderness.  Musculoskeletal:     Cervical back: Normal range of motion and neck supple.     Right lower leg: No edema.     Left lower leg: No edema.  Lymphadenopathy:     Cervical: No cervical adenopathy.  Skin:    General: Skin is warm and dry.  Neurological:     General: No focal deficit present.     Mental Status: She is alert and oriented to person, place, and time.  Psychiatric:        Mood and Affect: Mood normal.        Behavior: Behavior normal.            Assessment & Plan:  Fatigue- new.  Pt reports excessive fatigue that seems out of proportion w/ activity level.  She does report that she snores.  Will refer for sleep study to assess for possible OSA.  Will check labs to r/o metabolic causes.  Discussed that there may be a component of emotional fatigue and if that's the case, we may need to consider changing SSRI.  She is hesitant to do this as she has been on multiple medications in the past and had a hard time finding one that worked w/o significant side effects.  Will check labs and get sleep study prior to adjusting medications.  Pt expressed understanding and is in agreement w/ plan.   Irregular HR- new.  Pt reports that Augie Bliss has indicated an irregular heart beat.  EKG today WNL.  If symptoms persist, can do Zio for more comprehensive look at rhythm.  Pt expressed understanding and is in agreement w/ plan.

## 2024-02-27 ENCOUNTER — Ambulatory Visit: Payer: Self-pay | Admitting: Family Medicine

## 2024-02-28 LAB — HIV ANTIBODY (ROUTINE TESTING W REFLEX): HIV 1&2 Ab, 4th Generation: NONREACTIVE

## 2024-02-28 LAB — HEPATITIS C ANTIBODY: Hepatitis C Ab: NONREACTIVE

## 2024-04-21 ENCOUNTER — Ambulatory Visit: Admitting: Neurology

## 2024-04-21 ENCOUNTER — Encounter: Payer: Self-pay | Admitting: Neurology

## 2024-04-21 VITALS — BP 124/75 | HR 83 | Ht 69.0 in | Wt 276.0 lb

## 2024-04-21 DIAGNOSIS — Z9189 Other specified personal risk factors, not elsewhere classified: Secondary | ICD-10-CM | POA: Diagnosis not present

## 2024-04-21 DIAGNOSIS — G4710 Hypersomnia, unspecified: Secondary | ICD-10-CM | POA: Diagnosis not present

## 2024-04-21 DIAGNOSIS — Z87898 Personal history of other specified conditions: Secondary | ICD-10-CM | POA: Diagnosis not present

## 2024-04-21 NOTE — Patient Instructions (Addendum)
 ASSESSMENT AND PLAN 28 y.o. year old female  here with:    1) Prolonged sleep time with persistent hypersomnia and higher fatigue level.   No dreams.   2) depression: Patient is on Lexapro .  Never on SNRI, only used SSRI.    3)  snoring  as a risk factor for OSA, with a larger neck, elevated BMI of 41 after 40 pound weight loss by diet.   PLAN :  I like to first obtain a HST for this young patient with some OSA risk factors, but if negative would need PSG and MSLT to follow, she is in the process of weaning off SSRI anyway. 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  She had been on Wellbutrin  in the remote past, which did not work as well for her   I will order a narcolepsy HLA panel to eliminate the dx.   ADD/ ADHD testing with a specialist should be considered.    I plan to follow up either personally or through our NP within 3-4 months.   I would like to thank Mahlon Comer BRAVO, MD and Mahlon Comer BRAVO, Md [phone] [place],  [place] [address] for allowing me to meet with and to take care of this pleasant patient.    Hypersomnia Hypersomnia is a condition in which a person feels very tired during the day even though the person gets plenty of sleep at night. A person with this condition may take naps during the day and may find it very difficult to wake up from sleep. Hypersomnia may affect a person's ability to think, concentrate, drive, or remember things. What are the causes? The cause of this condition may not be known. Possible causes include: Taking certain medicines. Using drugs or alcohol. Sleep disorders, such as narcolepsy and sleep apnea. Injury to the head, brain, or spinal cord. Tumors. Certain medical conditions. These include: Depression. Diabetes. Gastroesophageal reflux disease (GERD). An underactive thyroid  gland (hypothyroidism). What are the signs or symptoms? The main symptoms of hypersomnia include: Feeling very tired throughout the day, regardless  of how much sleep you got the night before. Having trouble waking up. Others may find it difficult to wake you up when you are sleeping. Sleeping for longer and longer periods at a time. Taking naps throughout the day. Other symptoms may include: Feeling restless, anxious, or annoyed. Lacking energy. Having trouble with: Remembering. Speaking. Thinking. Loss of appetite. Seeing, hearing, tasting, smelling, or feeling things that are not real (hallucinations). How is this diagnosed? This condition may be diagnosed based on: Your symptoms and medical history. Your sleeping habits. Your health care provider may ask you to write down your sleeping habits in a daily sleep log, along with any symptoms you have. A series of tests that are done while you sleep (sleep study or polysomnogram). A test that measures how quickly you can fall asleep during the day (daytime nap study or multiple sleep latency test). How is this treated? This condition may be treated by: Following a regular sleep routine. Making lifestyle changes, such as changing your eating habits, getting regular exercise, and avoiding alcohol or caffeinated beverages. Taking medicines to make you more alert (stimulants) during the day. Treating any underlying medical causes of hypersomnia. Follow these instructions at home: Sleep habits Stick to a routine that includes going to bed and waking up at the same times every day and night. Practice a relaxing bedtime routine. This may include reading, meditation, deep breathing, or taking a warm bath  before going to sleep. Exercise regularly as told by your health care provider. However, avoid exercising in the hours right before bedtime. Keep your sleep environment at a cooler temperature, darkened, and quiet. Sleep with pillows and a mattress that are comfortable and supportive. Schedule short 20-minute naps for when you feel sleepiest during the day. Talk with your employer or  teachers about your hypersomnia. If possible, adjust your schedule so that: You have a regular daytime work schedule. You can take a scheduled nap during the day. You do not have to work or be active at night. Do not eat a heavy meal for a few hours before bedtime. Eat your meals at about the same times every day. Safety  Do not drive or use machinery if you are sleepy. Ask your health care provider if it is safe for you to drive. Wear a life jacket when swimming or spending time near water. General instructions  Take over-the-counter and prescription medicines only as told by your health care provider. This includes supplements. Avoid drinking alcohol or caffeinated beverages. Keep a sleep log that will help your health care provider manage your condition. This may include information about: What time you go to bed each night. How often you wake up at night. How many hours you sleep at night. How often and for how long you nap during the day. Any observations from others, such as leg movements during sleep, sleep walking, or snoring. Keep all follow-up visits. This is important. Contact a health care provider if: You have new symptoms. Your symptoms get worse. Get help right away if: You have thoughts about hurting yourself or someone else. Get help right away if you feel like you may hurt yourself or others, or have thoughts about taking your own life. Go to your nearest emergency room or: Call 911. Call the National Suicide Prevention Lifeline at 832 777 1958 or 988. This is open 24 hours a day. Text the Crisis Text Line at (551)575-5005. Summary Hypersomnia refers to a condition in which you feel very tired during the day even though you get plenty of sleep at night. A person with this condition may take naps during the day and may find it very difficult to wake up from sleep. Hypersomnia may affect a person's ability to think, concentrate, drive, or remember things. Treatment may  include a regular sleep routine and making some lifestyle changes. This information is not intended to replace advice given to you by your health care provider. Make sure you discuss any questions you have with your health care provider. Document Revised: 09/10/2021 Document Reviewed: 09/10/2021 Elsevier Patient Education  2024 [employer].

## 2024-04-21 NOTE — Progress Notes (Addendum)
 SLEEP MEDICINE CLINIC    Provider:  Dedra Gores, MD  Primary Care Physician:  Mahlon Comer BRAVO, MD [phone] [place] [address]     Referring Provider: Mahlon Comer BRAVO, Md [address] [place],  [place] [address]          Chief Complaint according to patient   Patient presents with:     New Patient (Initial Visit)     she is having significant daytime fatigue and does snore.  The sleepiness has  increased over the last 4-5 years,  She never feels well rested even if she gets 8 hrs of sleep.  She gets about 6-7 hrs of sleep on average.  She has no trouble with falling or staying asleep.  Never had sleep study. No known apnea events.  No known family history of sleep disorders.       HISTORY OF PRESENT ILLNESS:  Stefanie Farrell is a 28 y.o. female patient who is seen upon referral on 04/21/2024 from Dr mahlon  for a Sleep medicine evaluation. .  Chief concern according to patient : Reporting significant daytime fatigue and does snore. She never feels well rested even if she gets 8 hrs of sleep. She gets about 6-7 hrs of sleep on average. She has no trouble with falling or staying asleep. Never had sleep study. No known apnea events. No known family history of sleep disorders.    I have the pleasure of seeing Stefanie Farrell 04/21/24 a right -handed female with a possible sleep disorder.  She has a Pm Hx of anxiety and depression, but she had no insomnia concerns or problems.  She reports she doesn't dream or can't remember her dreams.  No Nocturia/ Enuresis ,no  Sleep walking, no Night terrors. Had her tonsils out for snoring and it reduced snoring.  Weight loss recently ; 40 pounds.     Family medical /sleep history: no  other family member  with a sleep disorder , one sister . Healthy, healthy parents.     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  Family  status is committed relation ship with her BF, he has noted no apneas ( she asked)  Tobacco use: none .  ETOH use :  seldomly ,  Caffeine intake in form of Coffee( 2 cups ) Soda( /) Tea ( /) and Monster ( 1 or less daily)  energy drinks Exercise in form of walking , 1 mile a day. .   Hobbies : fishing.       Sleep habits are as follows:  The patient's dinner time is between 5-6 PM. The patient goes to bed at 9-10 PM and continues to sleep for 6-7  hours, wakes  at 5 AM and has to go to work, she  naps every afternoon-  she leaves at 3.30 Pm and is home by 4 PM.  The bedroom is cool and quiet, dark.   The preferred sleep position is prone , with the support of 2 pillows.  Dreams are reportedly rare.   The patient wakes up with an alarm set for 5.30 . She snoozes the alarm frequently,  6  AM is the usual rise time. She reports not feeling refreshed or restored in AM, but has not dry mouth, morning headaches, and residual fatigue.  Naps are taken frequently, lasting from  2-3 hours  and are not much more refreshing than nocturnal sleep.  She has no trouble to go back to sleep at her usual bedtime.    Review of Systems: Out of a complete 14 system review, the patient complains of only the following symptoms, and all other reviewed systems are negative.:  Fatigue, sleepiness , snoring,  Possible ADHD or ADD.  Depression and Anxiety , well controlled on Lexapro .   Dissociative - trance.    How likely are you to doze in the following situations: 0 = not likely, 1 = slight chance, 2 = moderate chance, 3 = high chance   Sitting and Reading? Watching Television? Sitting inactive in a public place (theater or meeting)? As a passenger in a car for an hour without a break? Lying down in the afternoon when circumstances permit? Sitting and talking to someone? Sitting quietly after lunch without alcohol? In a car, while stopped for a few minutes in traffic?   Total = 8/ 24 points -  daily naps.   she is sleepy without the imperative  urge  to sleep.   Total sleep time is more like 8-11 hours.   FSS endorsed at 46/ 63 points.   No depression score obtained today.   Social History   Socioeconomic History   Marital status: Divorced    Spouse name: Not on file   Number of children: Not on file   Years of education: Not on file   Highest education level: Associate degree: academic program  Occupational History   Occupation: Chief Executive Officer: [employer]  Tobacco Use   Smoking status: Never   Smokeless tobacco: Never  Vaping Use   Vaping status: Never Used  Substance and Sexual Activity   Alcohol use: Yes    Comment: Occ   Drug use: No   Sexual activity: Yes    Birth control/protection: Pill, Condom  Other Topics Concern   Not on file  Social History Narrative   Not on file   Social Drivers of Health   Financial Resource Strain: Low Risk  (02/16/2024)   Overall Financial Resource Strain (CARDIA)    Difficulty of Paying Living Expenses: Not very hard  Food Insecurity: No Food Insecurity (02/16/2024)   Hunger Vital Sign    Worried About Running Out of Food in the Last Year: Never true    Ran Out of Food in the Last Year: Never true  Transportation Needs: No Transportation Needs (02/16/2024)   PRAPARE - Administrator, Civil Service (Medical): No    Lack of Transportation (Non-Medical): No  Physical Activity: Insufficiently Active (02/16/2024)   Exercise Vital Sign    Days of Exercise per Week: 2 days    Minutes of Exercise per Session: 30 min  Stress: Stress Concern Present (02/16/2024)   [employer] of Occupational Health - Occupational Stress Questionnaire    Feeling of Stress : To some extent  Social Connections: Socially Isolated (02/16/2024)   Social Connection and Isolation Panel    Frequency of Communication with Friends and Family: More than three times a week    Frequency of Social Gatherings with Friends and Family: Twice a week     Attends Religious Services: Never    Database administrator or Organizations: No    Attends Engineer, structural: Not on file    Marital Status: Divorced    Family History  Problem Relation Age of Onset   Cancer Maternal Grandmother  Cancer Father     Past Medical History:  Diagnosis Date   Anxiety    Depression     Past Surgical History:  Procedure Laterality Date   TONSILLECTOMY AND ADENOIDECTOMY       Current Outpatient Medications on File Prior to Visit  Medication Sig Dispense Refill   albuterol  (VENTOLIN  HFA) 108 (90 Base) MCG/ACT inhaler TAKE 2 PUFFS BY MOUTH EVERY 6 HOURS AS NEEDED FOR WHEEZE OR SHORTNESS OF BREATH 8.5 each 1   ALPRAZolam  (XANAX ) 0.5 MG tablet Take 1 tablet (0.5 mg total) by mouth 2 (two) times daily as needed for anxiety. 30 tablet 1   dicyclomine  (BENTYL ) 20 MG tablet TAKE ONE TABLET BY MOUTH THREE TIMES A DAY AS NEEDED FOR SPASMS 30 tablet 0   EPINEPHrine  (EPIPEN  2-PAK) 0.3 mg/0.3 mL IJ SOAJ injection Inject 0.3 mg into the muscle as needed for anaphylaxis. 1 each 0   escitalopram  (LEXAPRO ) 10 MG tablet Take 10 mg by mouth daily.     norethindrone -ethinyl estradiol (JUNEL 1/20) 1-20 MG-MCG tablet Take 1 tablet by mouth daily. 63 tablet 3   promethazine -dextromethorphan (PROMETHAZINE -DM) 6.25-15 MG/5ML syrup Take 5 mLs by mouth 4 (four) times daily as needed. 118 mL 0   No current facility-administered medications on file prior to visit.    Allergies  Allergen Reactions   Methylprednisolone Hives   Penicillins Other (See Comments)   Latex Hives and Rash   Penicillin G Rash    Did it involve swelling of the face/tongue/throat, SOB, or low BP? Yes Did it involve sudden or severe rash/hives, skin peeling, or any reaction on the inside of your mouth or nose? Unk Did you need to seek medical attention at a hospital or doctor's office? Unk When did it last happen? happened when I was a baby If all above answers are NO, may proceed with  cephalosporin use.    Shellfish-Derived Products Hives and Rash     DIAGNOSTIC DATA (LABS, IMAGING, TESTING) - I reviewed patient records, labs, notes, testing and imaging myself where available.  Lab Results  Component Value Date   WBC 7.4 02/26/2024   HGB 14.3 02/26/2024   HCT 42.7 02/26/2024   MCV 91.7 02/26/2024   PLT 284.0 02/26/2024      Component Value Date/Time   NA 141 02/26/2024 1336   K 3.9 02/26/2024 1336   CL 105 02/26/2024 1336   CO2 29 02/26/2024 1336   GLUCOSE 98 02/26/2024 1336   BUN 10 02/26/2024 1336   CREATININE 0.90 02/26/2024 1336   CALCIUM 9.6 02/26/2024 1336   PROT 6.9 02/26/2024 1336   ALBUMIN 4.4 02/26/2024 1336   AST 19 02/26/2024 1336   ALT 18 02/26/2024 1336   ALKPHOS 53 02/26/2024 1336   BILITOT 0.5 02/26/2024 1336   GFRNONAA >60 04/01/2019 2158   GFRAA >60 04/01/2019 2158   Lab Results  Component Value Date   CHOL 100 03/23/2020   HDL 30.50 (L) 03/23/2020   LDLCALC 51 03/23/2020   TRIG 94.0 03/23/2020   CHOLHDL 3 03/23/2020   Lab Results  Component Value Date   HGBA1C 5.3 03/23/2020   Lab Results  Component Value Date   VITAMINB12 372 02/26/2024   Lab Results  Component Value Date   TSH 0.84 02/26/2024    PHYSICAL EXAM:  Today's Vitals   04/21/24 0852  BP: 124/75  Pulse: 83  Weight: 276 lb (125.2 kg)  Height: 5' 9 (1.753 m)   Body mass index is 40.76 kg/m.  Wt Readings from Last 3 Encounters:  04/21/24 276 lb (125.2 kg)  02/26/24 270 lb 8 oz (122.7 kg)  01/15/24 269 lb (122 kg)     Ht Readings from Last 3 Encounters:  04/21/24 5' 9 (1.753 m)  02/26/24 5' 9 (1.753 m)  01/15/24 5' 9 (1.753 m)      General: The patient is awake, alert and appears not in acute distress. The patient is well groomed. She moves her left leg continuously,  tapping the floor .  Head: Normocephalic, atraumatic. Neck is supple.  Mallampati 3,  no tonsils.  neck circumference:16. 25 '  inches . Nasal airflow fully patent.    Retrognathia is not seen.  Dental status:  biological,  wore braces in the past.  Cardiovascular:  Regular rate and cardiac rhythm by pulse,  without distended neck veins. Respiratory: Lungs are clear to auscultation.  Skin:  Without evidence of ankle edema, or rash. Trunk: The patient's posture is erect.   NEUROLOGIC EXAM: The patient is awake and alert, oriented to place and time.   Memory subjective described as intact.  Attention span & concentration ability appears normal.  Speech is fluent,  without  dysarthria, dysphonia or aphasia.  Mood and affect are appropriate.   Cranial nerves: no loss of smell or taste reported  Pupils are equal and briskly reactive to light. Funduscopic exam deferred  : wears glasses. .  Extraocular movements in vertical and horizontal planes were intact and without nystagmus. No Diplopia. Visual fields by finger perimetry are intact. Hearing was intact to soft voice and finger rubbing.    Facial sensation intact to fine touch.  Facial motor strength is symmetric and tongue and uvula move midline.  Neck ROM : rotation, tilt and flexion extension were normal for age and shoulder shrug was symmetrical.    Motor exam:  Symmetric bulk, tone and ROM.   Normal tone without cog- wheeling,  symmetric grip strength.    Sensory:  Fine touch and vibration were normal.  Proprioception tested in the upper extremities was normal.   Coordination: Rapid alternating movements in the fingers/hands were of normal speed.  The Finger-to-nose maneuver was intact without evidence of ataxia, dysmetria or tremor.   Gait and station: Patient could rise unassisted from a seated position, walked without assistive device.  Stance is of normal width/ base and the patient turned with 3 steps.  Toe and heel walk were deferred.  Deep tendon reflexes: in the  upper and lower extremities are symmetric and intact.  Babinski response was deferred.    ASSESSMENT AND PLAN 28 y.o.  year old female  here with:    1) Prolonged sleep time with persistent hypersomnia and higher fatigue level.   No dreams. No cataplexy, mostly difficulties to get up in AM and not to fall asleep.   2) depression: Patient is on Lexapro .  Never on SNRI, only used SSRI and wellbutrin      3)  snoring  as a risk factor for OSA, with a larger neck, elevated BMI of 41 after 40 pound weight loss by diet.   PLAN :  I like to first obtain a HST for this young patient with some OSA risk factors, but if negative would need PSG and MSLT to follow, she is in the process of weaning off SSRI anyway. 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  She had been on Wellbutrin  in the remote past, which did not work as well for her   I will order a  narcolepsy HLA panel to eliminate the dx.   ADD/ ADHD testing with a specialist should be considered.    I plan to follow up either personally or through our NP within 3-4 months.   I would like to thank Mahlon Comer BRAVO, MD and Mahlon Comer BRAVO, Md [address] [place],  [place] [address] for allowing me to meet with and to take care of this pleasant patient.    After spending a total time of  45  minutes face to face and additional time for physical and neurologic examination, review of laboratory studies,  personal review of imaging studies, reports and results of other testing and review of referral information / records as far as provided in visit,   Electronically signed by: Dedra Gores, MD 04/21/2024 9:27 AM  [facility] and [facility] Board certified by The [employer] of Sleep Medicine and Diplomate of the [employer] of Sleep Medicine. Board certified In Neurology through the ABPN, Fellow of the [employer] of Neurology.

## 2024-05-03 DIAGNOSIS — U071 COVID-19: Secondary | ICD-10-CM | POA: Diagnosis not present

## 2024-05-06 ENCOUNTER — Encounter: Payer: Self-pay | Admitting: Student in an Organized Health Care Education/Training Program

## 2024-05-06 ENCOUNTER — Ambulatory Visit: Admitting: Student in an Organized Health Care Education/Training Program

## 2024-05-06 VITALS — BP 113/85 | HR 93 | Wt 276.0 lb

## 2024-05-06 DIAGNOSIS — U071 COVID-19: Secondary | ICD-10-CM | POA: Insufficient documentation

## 2024-05-06 NOTE — Assessment & Plan Note (Signed)
 Diagnosed with COVID-19. Symptoms are improving and she is no longer contagious. Prednisone  was prescribed by urgent care, recommend discontinuing. Advised mask use until Thursday next week upon returning to work.

## 2024-05-06 NOTE — Patient Instructions (Signed)
  VISIT SUMMARY: You were diagnosed with COVID-19 earlier this week and have been experiencing symptoms such as a sore throat, fatigue, and a cough. Your fever has resolved, and you are feeling significantly better. You came in today to discuss your condition and to get FMLA paperwork for your work absence.  YOUR PLAN: -COVID-19: COVID-19 is a viral infection that can cause symptoms like fever, cough, and fatigue. Your symptoms are improving, and you are no longer contagious. Continue to use a mask until Wednesday or Thursday next week when you return to work.  -FMLA DOCUMENTATION: You need FMLA documentation to excuse your absence from work due to COVID-19. We will complete and fax the FMLA paperwork for your absence from Monday through Friday.  INSTRUCTIONS: Please continue to rest and take care of yourself. Use a mask until Wednesday or Thursday next week when you return to work. If you have any worsening symptoms or new concerns, please contact our office.

## 2024-05-06 NOTE — Progress Notes (Signed)
   Acute Office Visit  Subjective:     Patient ID: Stefanie Farrell, female    DOB: 21-May-1996, 28 y.o.   MRN: [id number]  Chief Complaint  Patient presents with   Cough    Diagnosed on Monday for COVID and urgent care will not fill out FMLA paper work.     HPI  Discussed the use of AI scribe software for clinical note transcription with the patient, who gave verbal consent to proceed.  History of Present Illness Stefanie Farrell is a 28 year old female who presents with a recent COVID-19 diagnosis seeking FMLA paperwork for work absence.  She was diagnosed with COVID-19 on Monday after visiting urgent care. Initially, she experienced a sore throat, fatigue with exertion, and a cough. Her fever has resolved, with the last occurrence on Tuesday. She reports feeling significantly better since the diagnosis.  She was prescribed prednisone , which she believes has been somewhat helpful. She did not take Paxlovid. She lives alone and reports no other illnesses in her household.  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 She plans to return to work the following Monday.  No asthma or swelling. No other significant symptoms.      Objective:    BP 113/85   Pulse 93   Wt 276 lb (125.2 kg)   SpO2 100%   BMI 40.76 kg/m   Physical Exam  Gen: Mildly sick appearing Heart: Mildly tachycardic, no murmur Lungs: Unlabored, clear throughout no crackles or wheezing Ext: Warm, no edema     Assessment & Plan:   Assessment and Plan Assessment & Plan COVID-19   Diagnosed with COVID-19. Symptoms are improving and she is no longer contagious. Prednisone  was prescribed by urgent care, but the rationale is unclear. Advise mask use until Wednesday or Thursday next week upon returning to work.  FMLA Documentation   She requires FMLA documentation for work absence due  to COVID-19. Complete and fax FMLA paperwork for absence from Monday through Friday.    Problem List Items Addressed This Visit       Unprioritized   COVID-19 virus infection - Primary   Diagnosed with COVID-19. Symptoms are improving and she is no longer contagious. Prednisone  was prescribed by urgent care, recommend discontinuing. Advised mask use until Thursday next week upon returning to work.      I spent 20 minutes with the patient conducting history taking, physical examination, and medical decision making. This time was medically necessary given the complexity of the patient's concerns and conditions.    Return if symptoms worsen or fail to improve.  Cleatus Debby Specking, MD

## 2024-05-24 ENCOUNTER — Telehealth: Payer: Self-pay | Admitting: Neurology

## 2024-05-24 NOTE — Telephone Encounter (Signed)
 HST- [employer] no auth req   Patient is scheduled at [facility] for 06/15/24 at 10 AM mailed packet and sent mychart

## 2024-06-15 ENCOUNTER — Ambulatory Visit (INDEPENDENT_AMBULATORY_CARE_PROVIDER_SITE_OTHER): Admitting: Neurology

## 2024-06-15 ENCOUNTER — Telehealth: Payer: Self-pay | Admitting: Neurology

## 2024-06-15 DIAGNOSIS — Z9189 Other specified personal risk factors, not elsewhere classified: Secondary | ICD-10-CM

## 2024-06-15 DIAGNOSIS — G4710 Hypersomnia, unspecified: Secondary | ICD-10-CM

## 2024-06-15 DIAGNOSIS — R5382 Chronic fatigue, unspecified: Secondary | ICD-10-CM

## 2024-06-15 DIAGNOSIS — Z87898 Personal history of other specified conditions: Secondary | ICD-10-CM

## 2024-06-16 NOTE — Progress Notes (Unsigned)
 SABRA

## 2024-06-18 ENCOUNTER — Ambulatory Visit: Payer: Self-pay | Admitting: Neurology

## 2024-06-18 DIAGNOSIS — Z87898 Personal history of other specified conditions: Secondary | ICD-10-CM

## 2024-06-18 DIAGNOSIS — R5382 Chronic fatigue, unspecified: Secondary | ICD-10-CM | POA: Insufficient documentation

## 2024-06-18 DIAGNOSIS — G4710 Hypersomnia, unspecified: Secondary | ICD-10-CM

## 2024-06-18 NOTE — Procedures (Signed)
 [facility]  Stefanie Farrell 28 year old female 1996/08/11   HOME SLEEP TEST REPORT ( by Watch PAT)   STUDY DATE:  06-15-2024   ORDERING CLINICIAN:  REFERRING CLINICIAN: PCP   CLINICAL INFORMATION/HISTORY:  04-21-2024 NP  per Dr Mahlon.   I have the pleasure of seeing Stefanie Farrell 04/21/24 a right -handed female with Hypersomnia. Total sleep time is  8-11 hours and yet never feels enough.  She has a Pm Hx of anxiety and depression, but she had no insomnia concerns or problems.  She reports she doesn't dream or can't remember her dreams. Reports no Nocturia/ Enuresis ,no Sleep walking, no Night terrors. Had a tonsillectomy for snoring . ( No sleep paralysis, no vivid dreams and no cataplexy, power naps are not refreshing).   The patient wakes up with an alarm set for 5.30 AM but snoozes the alarm frequently,  6 AM is the usual rise time. She reports not feeling refreshed or restored in AM, but has not noted a  dry mouth, nor morning headaches,only  residual fatigue.  Naps are taken frequently, lasting from 2-3 hours and are not much more refreshing than nocturnal sleep.  She has no trouble to go back to sleep at her usual bedtime.  Weight loss recently ; 40 pounds.     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 Family status is committed relation ship with her BF, he has noted no apneas ( she asked)     Epworth sleepiness score:8/ 24 points - takes daily naps- she is sleepy without the imperative urge  to sleep.  FSS endorsed at 46/ 63 points.      BMI: 40-41  kg/m   Neck Circumference:  16.25'    FINDINGS:   Sleep Summary:   Total Recording Time (hours, min):     10 hours   Total Sleep Time (hours, min):    9 hours 2 minutes             Percent REM (%): 22.3%                                      Respiratory Indices:   Calculated pAHI (per AASM or CMS  guideline):SABRA    Following AASM guidelines this patient's AHI is 2.5/h                     REM pAHI:   2/h                                              NREM pAHI:   2.6/h                           Positional AHI:    The patient slept Monday in nonsupine sleep.    Snoring reached a mean volume of 41 dB which is considered just above threshold of detection.  Snoring was recorded for 22% of total recorded time.  Oxygen Saturation Statistics: Oxygen Saturation (%) was 97% at mean value between the nadir at 92 and a maximum saturation of 100%  O2 Saturation (minutes) <89%: 0 minutes          Pulse Rate Statistics:   Pulse mean heart rate was 64 bpm , between 45 and 107 bpm .                     IMPRESSION:  This HST could not detect sleep apnea to be present only snoring was recorded.  There was no significant hypoxic burden and snoring was not clinically relevant.  No central events were noted.    RECOMMENDATION: This home sleep test cannot identify a cause for  non-restorative sleep   Based on this home sleep test no organic sleep disorder can be identified .  The reported fatigue degree can be related to a mood disorder  . The patient did not endorse sleep attacks ore the irrestible urge to need to sleep, simply fatigue  was endorsed , and hypersomnia.     I  suspected that there may be an underlying ADHD condition ( fidgety) and idiopathic hypersomnia.  I have ordered a narcolepsy HLA test to complete this evaluation which has not been drawn yet.    I will now proceed to PSG and MSLT testing.  For this to be a valid test, a patient needs to wean off antidepressants and stimulants .  Follow up after  those tests are resulted , If I can be of additional help, let me know.     GENERAL TIPS :   Any patient should be cautioned not to drive, work at heights, or operate dangerous or heavy equipment when tired or sleepy.   Review of good sleep hygiene measures  is accessible to any sleep clinic patient and can be reiterated through online material- I we recommend the Guide to better Sleep   by the NIH.   The referring physician will be notified of the test results.       INTERPRETING PHYSICIAN:   Dedra Gores, MD  [facility] and [facility] Sleep Board certified by The [employer] of Sleep Medicine and Diplomate of the [employer] of Sleep Medicine. Board certified In Neurology through the ABPN, Fellow of the [employer] of Neurology.

## 2024-06-22 ENCOUNTER — Other Ambulatory Visit: Payer: Self-pay

## 2024-06-22 MED ORDER — ESCITALOPRAM OXALATE 10 MG PO TABS: 10.0000 mg | ORAL_TABLET | Freq: Every day | ORAL | 1 refills | Status: AC

## 2024-06-22 NOTE — Progress Notes (Signed)
 Lvm to let pt know refill has been sent

## 2024-06-22 NOTE — Progress Notes (Signed)
 Refill has been sent.

## 2024-06-28 ENCOUNTER — Encounter: Payer: Self-pay | Admitting: Family Medicine

## 2024-06-28 NOTE — Telephone Encounter (Signed)
 Neurology is wanting patient to wean off lexapro , patient is down to 10 mg a day for 3 months but s working on cutting down to 5mg  this month. Patient states she has been having some troubles with fatigue and is wondering if you would be able to order labs to test hormone levels? Patient reached out to OB/GYN to request these labs but was referred back to you.

## 2024-06-29 ENCOUNTER — Telehealth: Payer: Self-pay | Admitting: Neurology

## 2024-06-29 NOTE — Telephone Encounter (Signed)
 NPSG/MSLT [facility] aetna no auth req .  Sent mychart.

## 2024-06-30 ENCOUNTER — Encounter: Payer: Self-pay | Admitting: Neurology

## 2024-07-06 ENCOUNTER — Ambulatory Visit (INDEPENDENT_AMBULATORY_CARE_PROVIDER_SITE_OTHER): Admitting: Family Medicine

## 2024-07-06 ENCOUNTER — Encounter: Payer: Self-pay | Admitting: Family Medicine

## 2024-07-06 VITALS — BP 118/68 | HR 68 | Temp 98.0°F | Ht 69.0 in | Wt 281.1 lb

## 2024-07-06 DIAGNOSIS — R5382 Chronic fatigue, unspecified: Secondary | ICD-10-CM | POA: Diagnosis not present

## 2024-07-06 DIAGNOSIS — Z23 Encounter for immunization: Secondary | ICD-10-CM | POA: Diagnosis not present

## 2024-07-06 LAB — CORTISOL: Cortisol, Plasma: 21.3 ug/dL

## 2024-07-06 LAB — TESTOSTERONE: Testosterone: 58.71 ng/dL — ABNORMAL HIGH (ref 15.00–40.00)

## 2024-07-06 NOTE — Assessment & Plan Note (Signed)
 Ongoing issue.  Pt does not have OSA after undergoing a sleep study.  She has a narcolepsy evaluation upcoming but that's not until January.  We have done extensive testing previously but will look at possible Endocrine causes today.  Check labs and adjust any underlying abnormality if present.  Encouraged her to f/u w/ Neuro to see if Narcolepsy evaluation could be expedited.  Pt expressed understanding and is in agreement w/ plan.

## 2024-07-06 NOTE — Patient Instructions (Signed)
 Follow up as needed or as scheduled We'll notify you of your lab results and make any changes if needed Reach out to Neuro and ask about the Narcolepsy panel Call with any questions or concerns Hang in there!!!

## 2024-07-06 NOTE — Progress Notes (Signed)
   Subjective:    Patient ID: Stefanie Farrell, female    DOB: Aug 17, 1996, 28 y.o.   MRN: [id number]  HPI Fatigue- pt has had a sleep study which ruled out OSA.  She has an upcoming test in January to assess for Narcolepsy.  Pt reports that last week she reported to work at 7 and had to go home b/c she was so tired she was not functional and not able to work.  Would like to do additional lab testing to see if there is an underlying cause.   Review of Systems For ROS see HPI     Objective:   Physical Exam Vitals reviewed.  Constitutional:      General: She is not in acute distress.    Appearance: Normal appearance. She is obese. She is not ill-appearing.  HENT:     Head: Normocephalic and atraumatic.  Eyes:     Extraocular Movements: Extraocular movements intact.     Conjunctiva/sclera: Conjunctivae normal.  Cardiovascular:     Rate and Rhythm: Normal rate and regular rhythm.  Pulmonary:     Effort: Pulmonary effort is normal. No respiratory distress.  Musculoskeletal:     Cervical back: Normal range of motion and neck supple.  Skin:    General: Skin is warm and dry.  Neurological:     General: No focal deficit present.     Mental Status: She is alert and oriented to person, place, and time.  Psychiatric:        Mood and Affect: Mood normal.        Behavior: Behavior normal.        Thought Content: Thought content normal.           Assessment & Plan:

## 2024-07-07 ENCOUNTER — Ambulatory Visit: Payer: Self-pay | Admitting: Family Medicine

## 2024-07-07 DIAGNOSIS — R7989 Other specified abnormal findings of blood chemistry: Secondary | ICD-10-CM

## 2024-07-07 LAB — FSH/LH
FSH: 4.7 m[IU]/mL
LH: 3.7 m[IU]/mL

## 2024-07-07 LAB — PROLACTIN: Prolactin: 10.4 ng/mL

## 2024-07-07 LAB — ESTRADIOL: Estradiol: 26 pg/mL

## 2024-07-08 ENCOUNTER — Other Ambulatory Visit: Payer: Self-pay | Admitting: Family Medicine

## 2024-07-08 DIAGNOSIS — R7989 Other specified abnormal findings of blood chemistry: Secondary | ICD-10-CM

## 2024-07-08 NOTE — Progress Notes (Signed)
 Order changed as Endo doesn't tx or evaluate high testosterone  in females

## 2024-07-20 NOTE — Telephone Encounter (Signed)
 error

## 2024-07-27 NOTE — Telephone Encounter (Signed)
 Patient sent separate mychart message which was addressed.

## 2024-08-02 NOTE — Telephone Encounter (Signed)
Please advise on this referral

## 2024-08-03 NOTE — Telephone Encounter (Signed)
 Patient states she is willing to go where ever you recommend. As well as she was seen by a midwife when she went to the first OBG[facility] clinic.

## 2024-08-16 DIAGNOSIS — G4710 Hypersomnia, unspecified: Secondary | ICD-10-CM | POA: Diagnosis not present

## 2024-08-16 DIAGNOSIS — E288 Other ovarian dysfunction: Secondary | ICD-10-CM | POA: Diagnosis not present

## 2024-08-16 DIAGNOSIS — R5382 Chronic fatigue, unspecified: Secondary | ICD-10-CM | POA: Diagnosis not present

## 2024-08-16 DIAGNOSIS — E669 Obesity, unspecified: Secondary | ICD-10-CM | POA: Diagnosis not present

## 2024-08-29 ENCOUNTER — Encounter

## 2024-08-30 ENCOUNTER — Encounter

## 2024-09-13 ENCOUNTER — Telehealth: Payer: Self-pay | Admitting: Neurology

## 2024-09-13 NOTE — Telephone Encounter (Signed)
 Patient is schedule for a f/u with Dr. Chalice on 10/28/2024 at 11 AM..  Sent mychart to go ahead and schedule NPSG/MSLT.

## 2024-09-21 NOTE — Telephone Encounter (Signed)
 Okay to refill?  Requested Prescriptions   Pending Prescriptions Disp Refills   dicyclomine  (BENTYL ) 20 MG tablet [Pharmacy Med Name: DICYCLOMINE  20 MG TABLET] 30 tablet 0    Sig: TAKE 1 TABLET BY MOUTH THREE TIMES A DAY AS NEEDED FOR SPASMS.     Date of patient request: 09/21/24 Last office visit: Visit date not found Upcoming visit: Visit date not found Date of last refill: 10/21/23 Last refill amount: 30

## 2024-10-06 ENCOUNTER — Other Ambulatory Visit: Payer: Self-pay

## 2024-10-06 DIAGNOSIS — M62838 Other muscle spasm: Secondary | ICD-10-CM

## 2024-10-06 MED ORDER — DICYCLOMINE HCL 20 MG PO TABS: ORAL_TABLET | ORAL | 0 refills | Status: AC

## 2024-10-28 ENCOUNTER — Institutional Professional Consult (permissible substitution): Admitting: Neurology

## 2024-11-10 ENCOUNTER — Encounter

## 2024-11-11 ENCOUNTER — Encounter
